# Patient Record
Sex: Female | Born: 1955 | Race: White | Hispanic: No | Smoking: Never smoker
Health system: Southern US, Community
[De-identification: ages and names within clinical notes are randomized; demographics above are authoritative.]

## PROBLEM LIST (undated history)

## (undated) DIAGNOSIS — I10 Essential (primary) hypertension: Secondary | ICD-10-CM

## (undated) DIAGNOSIS — S7290XA Unspecified fracture of unspecified femur, initial encounter for closed fracture: Secondary | ICD-10-CM

## (undated) DIAGNOSIS — E119 Type 2 diabetes mellitus without complications: Secondary | ICD-10-CM

## (undated) DIAGNOSIS — G4730 Sleep apnea, unspecified: Secondary | ICD-10-CM

## (undated) DIAGNOSIS — S329XXA Fracture of unspecified parts of lumbosacral spine and pelvis, initial encounter for closed fracture: Secondary | ICD-10-CM

## (undated) DIAGNOSIS — E0500 Thyrotoxicosis with diffuse goiter without thyrotoxic crisis or storm: Secondary | ICD-10-CM

## (undated) HISTORY — PX: COLONOSCOPY: SHX174

---
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
## 2004-05-07 ENCOUNTER — Ambulatory Visit: Payer: Self-pay | Admitting: Unknown Physician Specialty

## 2005-06-01 ENCOUNTER — Ambulatory Visit: Payer: Self-pay | Admitting: Internal Medicine

## 2005-06-03 ENCOUNTER — Emergency Department: Payer: Self-pay | Admitting: Emergency Medicine

## 2005-06-09 ENCOUNTER — Ambulatory Visit: Payer: Self-pay | Admitting: Oncology

## 2005-06-17 ENCOUNTER — Other Ambulatory Visit: Admission: RE | Admit: 2005-06-17 | Discharge: 2005-06-17 | Payer: Self-pay | Admitting: Oncology

## 2005-07-02 ENCOUNTER — Ambulatory Visit (HOSPITAL_COMMUNITY): Admission: RE | Admit: 2005-07-02 | Discharge: 2005-07-02 | Payer: Self-pay | Admitting: Oncology

## 2005-07-06 ENCOUNTER — Ambulatory Visit (HOSPITAL_COMMUNITY): Admission: RE | Admit: 2005-07-06 | Discharge: 2005-07-06 | Payer: Self-pay | Admitting: Oncology

## 2005-07-14 ENCOUNTER — Other Ambulatory Visit: Admission: RE | Admit: 2005-07-14 | Discharge: 2005-07-14 | Payer: Self-pay | Admitting: Oncology

## 2005-08-10 ENCOUNTER — Other Ambulatory Visit: Admission: RE | Admit: 2005-08-10 | Discharge: 2005-08-10 | Payer: Self-pay | Admitting: Oncology

## 2005-08-25 ENCOUNTER — Ambulatory Visit: Payer: Self-pay | Admitting: Gastroenterology

## 2006-02-19 ENCOUNTER — Emergency Department: Payer: Self-pay | Admitting: Unknown Physician Specialty

## 2007-06-15 ENCOUNTER — Emergency Department: Payer: Self-pay | Admitting: Emergency Medicine

## 2007-06-15 ENCOUNTER — Other Ambulatory Visit: Payer: Self-pay

## 2013-10-18 ENCOUNTER — Ambulatory Visit: Payer: Self-pay | Admitting: Family Medicine

## 2013-10-26 ENCOUNTER — Ambulatory Visit: Payer: Self-pay | Admitting: Gastroenterology

## 2013-10-27 ENCOUNTER — Emergency Department: Payer: Self-pay | Admitting: Emergency Medicine

## 2013-10-27 LAB — URINALYSIS, COMPLETE
BILIRUBIN, UR: NEGATIVE
Bacteria: NONE SEEN
Glucose,UR: NEGATIVE mg/dL (ref 0–75)
KETONE: NEGATIVE
Nitrite: NEGATIVE
PH: 6 (ref 4.5–8.0)
Protein: NEGATIVE
RBC,UR: 45 /HPF (ref 0–5)
SPECIFIC GRAVITY: 1.015 (ref 1.003–1.030)
Squamous Epithelial: 3

## 2013-10-27 LAB — COMPREHENSIVE METABOLIC PANEL
ALBUMIN: 3.9 g/dL (ref 3.4–5.0)
ALT: 29 U/L (ref 12–78)
Alkaline Phosphatase: 129 U/L — ABNORMAL HIGH
Anion Gap: 5 — ABNORMAL LOW (ref 7–16)
BUN: 17 mg/dL (ref 7–18)
Bilirubin,Total: 0.2 mg/dL (ref 0.2–1.0)
CHLORIDE: 106 mmol/L (ref 98–107)
Calcium, Total: 8.8 mg/dL (ref 8.5–10.1)
Co2: 29 mmol/L (ref 21–32)
Creatinine: 0.75 mg/dL (ref 0.60–1.30)
GLUCOSE: 115 mg/dL — AB (ref 65–99)
OSMOLALITY: 282 (ref 275–301)
Potassium: 3.7 mmol/L (ref 3.5–5.1)
SGOT(AST): 21 U/L (ref 15–37)
SODIUM: 140 mmol/L (ref 136–145)
Total Protein: 7.6 g/dL (ref 6.4–8.2)

## 2013-10-27 LAB — CBC
HCT: 44 % (ref 35.0–47.0)
HGB: 14 g/dL (ref 12.0–16.0)
MCH: 30.6 pg (ref 26.0–34.0)
MCHC: 31.9 g/dL — ABNORMAL LOW (ref 32.0–36.0)
MCV: 96 fL (ref 80–100)
Platelet: 285 10*3/uL (ref 150–440)
RBC: 4.58 10*6/uL (ref 3.80–5.20)
RDW: 13.7 % (ref 11.5–14.5)
WBC: 13.7 10*3/uL — AB (ref 3.6–11.0)

## 2013-11-05 ENCOUNTER — Ambulatory Visit: Payer: Self-pay | Admitting: Urology

## 2013-11-09 ENCOUNTER — Ambulatory Visit: Payer: Self-pay | Admitting: Urology

## 2014-10-11 ENCOUNTER — Ambulatory Visit
Admit: 2014-10-11 | Disposition: A | Discharge status: Home / Self Care | Payer: Self-pay | Attending: Internal Medicine | Admitting: Internal Medicine

## 2015-12-18 ENCOUNTER — Emergency Department
Admission: EM | Admit: 2015-12-18 | Discharge: 2015-12-19 | Disposition: A | Discharge status: Home / Self Care | Payer: Managed Care, Other (non HMO) | Attending: Emergency Medicine | Admitting: Emergency Medicine

## 2015-12-18 DIAGNOSIS — Y939 Activity, unspecified: Secondary | ICD-10-CM | POA: Insufficient documentation

## 2015-12-18 DIAGNOSIS — T18108A Unspecified foreign body in esophagus causing other injury, initial encounter: Secondary | ICD-10-CM | POA: Insufficient documentation

## 2015-12-18 DIAGNOSIS — X58XXXA Exposure to other specified factors, initial encounter: Secondary | ICD-10-CM | POA: Insufficient documentation

## 2015-12-18 DIAGNOSIS — Y999 Unspecified external cause status: Secondary | ICD-10-CM | POA: Diagnosis not present

## 2015-12-18 DIAGNOSIS — Y929 Unspecified place or not applicable: Secondary | ICD-10-CM | POA: Diagnosis not present

## 2015-12-18 DIAGNOSIS — T18128A Food in esophagus causing other injury, initial encounter: Secondary | ICD-10-CM

## 2015-12-18 DIAGNOSIS — S1015XA Superficial foreign body of throat, initial encounter: Secondary | ICD-10-CM | POA: Diagnosis present

## 2015-12-18 MED ORDER — GLUCAGON HCL (RDNA) 1 MG IJ SOLR
1.0000 mg | Freq: Once | INTRAMUSCULAR | Status: AC
  Administered 2015-12-18: 1 mg via INTRAVENOUS

## 2015-12-18 MED ORDER — GLUCAGON HCL RDNA (DIAGNOSTIC) 1 MG IJ SOLR
INTRAMUSCULAR | Status: AC
  Administered 2015-12-18: 1 mg via INTRAVENOUS
  Filled 2015-12-18: qty 1

## 2015-12-18 NOTE — ED Notes (Addendum)
Gave pt coke after glucagon injection per MD Manson PasseyBrown request. Pt vomited coke immediately. Pt reports she still feels object stuck in upper abdomen. MD Manson PasseyBrown notified

## 2015-12-18 NOTE — ED Notes (Signed)
Pt with feeling like she has something stuck in throat after eating chicken nuggets, pt spitting in triage.

## 2015-12-18 NOTE — ED Notes (Signed)
Pt reports eating a chicken nugget at approx 1900 this evening and reports it feels like it got stuck in her upper medial abdomen. Pt c/o upper medial abdominal pain and nausea

## 2015-12-19 MED ORDER — ONDANSETRON HCL 4 MG/2ML IJ SOLN
INTRAMUSCULAR | Status: AC
  Filled 2015-12-19: qty 2

## 2015-12-19 MED ORDER — MORPHINE SULFATE (PF) 2 MG/ML IV SOLN
2.0000 mg | Freq: Once | INTRAVENOUS | Status: AC
  Administered 2015-12-19: 2 mg via INTRAVENOUS
  Filled 2015-12-19: qty 1

## 2015-12-19 MED ORDER — GLUCAGON HCL (RDNA) 1 MG IJ SOLR: 1.0000 mg | Freq: Once | INTRAMUSCULAR | Status: DC

## 2015-12-19 MED ORDER — ONDANSETRON HCL 4 MG/2ML IJ SOLN
4.0000 mg | Freq: Once | INTRAMUSCULAR | Status: AC
  Administered 2015-12-19: 4 mg via INTRAVENOUS

## 2015-12-19 NOTE — ED Notes (Signed)
Pt became nauseated after morphine administration. MD Manson PasseyBrown notified. MD ordered 4 mg Zofran IV

## 2015-12-19 NOTE — ED Notes (Signed)
Pt's Niece Lillia Paulsllen McBane would like to be called for any changes in pt status: [phone]. Pt gave approval to speak with niece

## 2015-12-19 NOTE — ED Notes (Signed)
Pt reports symptoms have resolved. MD Manson PasseyBrown ordered PO challenge. Pt brought water.

## 2015-12-19 NOTE — ED Notes (Signed)
MD Manson PasseyBrown ordered to hold glucagon

## 2015-12-19 NOTE — ED Provider Notes (Signed)
[facility] Emergency Department Provider Note  ____________________________________________  Time seen: 11:50 PM  I have reviewed the triage vital signs and the nursing notes.   HISTORY  Chief Complaint Foreign Body      HPI Jackie Robinson is a 60 y.o. female presents with sensation of something stuck in her throat since eating chicken nuggets at approximately 7 PM. Patient points to the lower chest as location of the sensation. Patient admits to inability to swallow oral secretions. To be spitting and a emesis bag at the time of evaluation.     Past medical history Esophageal dilation There are no active problems to display for this patient.   Past surgical history Esophageal dilation No current outpatient prescriptions on file.  Allergies Amoxicillin and Contrast media  No family history on file.  Social History Social History  Substance Use Topics  . Smoking status: Not on file  . Smokeless tobacco: Not on file  . Alcohol Use: Not on file    Review of Systems  Constitutional: Negative for fever. Eyes: Negative for visual changes. ENT: Negative for sore throat.Positive for foreign body in throat Cardiovascular: Negative for chest pain. Respiratory: Negative for shortness of breath. Gastrointestinal: Negative for abdominal pain, vomiting and diarrhea. Genitourinary: Negative for dysuria. Musculoskeletal: Negative for back pain. Skin: Negative for rash. Neurological: Negative for headaches, focal weakness or numbness.   10-point ROS otherwise negative.  ____________________________________________   PHYSICAL EXAM:  VITAL SIGNS: ED Triage Vitals  Enc Vitals Group     BP 12/18/15 2330 139/99 mmHg     Pulse Rate 12/18/15 2330 79     Resp 12/18/15 2330 18     Temp 12/18/15 2330 97.7 F (36.5 C)     Temp Source 12/18/15 2330 Oral     SpO2 12/18/15 2330 99 %     Weight 12/18/15 2330 175 lb (79.379 kg)     Height 12/18/15  2330 5\' 6"  (1.676 m)     Head Cir --      Peak Flow --      Pain Score 12/18/15 2331 8     Pain Loc --      Pain Edu? --      Excl. in GC? --     Constitutional: Alert and oriented. Actively spitting in the emesis bag Eyes: Conjunctivae are normal. PERRL. Normal extraocular movements. ENT   Head: Normocephalic and atraumatic.   Nose: No congestion/rhinnorhea.   Mouth/Throat: Mucous membranes are moist.   Neck: No stridor. Hematological/Lymphatic/Immunilogical: No cervical lymphadenopathy. Cardiovascular: Normal rate, regular rhythm. Normal and symmetric distal pulses are present in all extremities. No murmurs, rubs, or gallops. Respiratory: Normal respiratory effort without tachypnea nor retractions. Breath sounds are clear and equal bilaterally. No wheezes/rales/rhonchi. Gastrointestinal: Soft and nontender. No distention. There is no CVA tenderness. Genitourinary: deferred Musculoskeletal: Nontender with normal range of motion in all extremities. No joint effusions.  No lower extremity tenderness nor edema. Neurologic:  Normal speech and language. No gross focal neurologic deficits are appreciated. Speech is normal.  Skin:  Skin is warm, dry and intact. No rash noted. Psychiatric: Mood and affect are normal. Speech and behavior are normal. Patient exhibits appropriate insight and judgment.    Procedures    INITIAL IMPRESSION / ASSESSMENT AND PLAN / ED COURSE  Pertinent labs & imaging results that were available during my care of the patient were reviewed by me and considered in my medical decision making (see chart for details).  Patient received  IV glucagon 1 mg as well as 2 mg of morphine without any improvement in symptoms.  I notified Dr. Servando SnareWohl gastroenterology on-call   I was notified by the nursing staff that the patient had continued discomfort and a such additional dose of morphine was given. 3:10 AM I reevaluated patient who stated that sensation has  resolved. She is tolerating oral secretions at this time liquid was given a drink that was given and tolerated well. ____________________________________________   FINAL CLINICAL IMPRESSION(S) / ED DIAGNOSES  Final diagnoses:  Food impaction of esophagus, initial encounter      Darci Currentandolph N Brown, MD 12/19/15 [phone]

## 2016-11-05 ENCOUNTER — Other Ambulatory Visit: Payer: Self-pay | Admitting: Internal Medicine

## 2016-11-05 DIAGNOSIS — L03211 Cellulitis of face: Secondary | ICD-10-CM

## 2016-11-05 DIAGNOSIS — E890 Postprocedural hypothyroidism: Secondary | ICD-10-CM

## 2016-11-05 DIAGNOSIS — I10 Essential (primary) hypertension: Secondary | ICD-10-CM

## 2016-11-08 ENCOUNTER — Ambulatory Visit
Admission: RE | Admit: 2016-11-08 | Discharge: 2016-11-08 | Disposition: A | Discharge status: Home / Self Care | Payer: BLUE CROSS/BLUE SHIELD | Source: Ambulatory Visit | Attending: Internal Medicine | Admitting: Internal Medicine

## 2016-11-08 DIAGNOSIS — E890 Postprocedural hypothyroidism: Secondary | ICD-10-CM

## 2016-11-08 DIAGNOSIS — L03211 Cellulitis of face: Secondary | ICD-10-CM

## 2016-11-08 DIAGNOSIS — I10 Essential (primary) hypertension: Secondary | ICD-10-CM

## 2016-11-09 ENCOUNTER — Ambulatory Visit
Admission: RE | Admit: 2016-11-09 | Discharge: 2016-11-09 | Disposition: A | Discharge status: Home / Self Care | Payer: BLUE CROSS/BLUE SHIELD | Source: Ambulatory Visit | Attending: Internal Medicine | Admitting: Internal Medicine

## 2016-11-09 DIAGNOSIS — H0520 Unspecified exophthalmos: Secondary | ICD-10-CM | POA: Insufficient documentation

## 2016-11-09 DIAGNOSIS — I878 Other specified disorders of veins: Secondary | ICD-10-CM | POA: Insufficient documentation

## 2016-11-09 DIAGNOSIS — L03211 Cellulitis of face: Secondary | ICD-10-CM

## 2016-11-09 DIAGNOSIS — E890 Postprocedural hypothyroidism: Secondary | ICD-10-CM

## 2016-11-09 DIAGNOSIS — I10 Essential (primary) hypertension: Secondary | ICD-10-CM

## 2016-11-09 HISTORY — DX: Type 2 diabetes mellitus without complications: E11.9

## 2016-11-09 HISTORY — DX: Essential (primary) hypertension: I10

## 2016-11-09 MED ORDER — IOPAMIDOL (ISOVUE-300) INJECTION 61%
75.0000 mL | Freq: Once | INTRAVENOUS | Status: AC | PRN
  Administered 2016-11-09: 75 mL via INTRAVENOUS

## 2016-11-24 ENCOUNTER — Ambulatory Visit: Payer: BLUE CROSS/BLUE SHIELD

## 2016-12-23 ENCOUNTER — Other Ambulatory Visit: Payer: Self-pay | Admitting: Infectious Diseases

## 2016-12-23 DIAGNOSIS — L03213 Periorbital cellulitis: Secondary | ICD-10-CM

## 2016-12-30 ENCOUNTER — Ambulatory Visit
Admission: RE | Admit: 2016-12-30 | Discharge: 2016-12-30 | Disposition: A | Discharge status: Home / Self Care | Payer: BLUE CROSS/BLUE SHIELD | Source: Ambulatory Visit | Attending: Infectious Diseases | Admitting: Infectious Diseases

## 2016-12-30 DIAGNOSIS — H0520 Unspecified exophthalmos: Secondary | ICD-10-CM | POA: Diagnosis not present

## 2016-12-30 DIAGNOSIS — L03213 Periorbital cellulitis: Secondary | ICD-10-CM | POA: Insufficient documentation

## 2016-12-30 MED ORDER — GADOBENATE DIMEGLUMINE 529 MG/ML IV SOLN
18.0000 mL | Freq: Once | INTRAVENOUS | Status: AC | PRN
  Administered 2016-12-30: 18 mL via INTRAVENOUS

## 2017-01-24 ENCOUNTER — Ambulatory Visit
Admission: RE | Admit: 2017-01-24 | Discharge: 2017-01-24 | Disposition: A | Discharge status: Home / Self Care | Payer: BLUE CROSS/BLUE SHIELD | Source: Ambulatory Visit | Attending: Ophthalmology | Admitting: Ophthalmology

## 2017-01-24 DIAGNOSIS — E0500 Thyrotoxicosis with diffuse goiter without thyrotoxic crisis or storm: Secondary | ICD-10-CM | POA: Insufficient documentation

## 2017-01-24 DIAGNOSIS — H0520 Unspecified exophthalmos: Secondary | ICD-10-CM | POA: Insufficient documentation

## 2017-01-24 HISTORY — DX: Unspecified fracture of unspecified femur, initial encounter for closed fracture: S72.90XA

## 2017-01-24 HISTORY — DX: Thyrotoxicosis with diffuse goiter without thyrotoxic crisis or storm: E05.00

## 2017-01-24 HISTORY — DX: Sleep apnea, unspecified: G47.30

## 2017-01-24 HISTORY — DX: Fracture of unspecified parts of lumbosacral spine and pelvis, initial encounter for closed fracture: S32.9XXA

## 2017-01-24 LAB — COMPREHENSIVE METABOLIC PANEL
ALK PHOS: 76 U/L (ref 38–126)
ALT: 25 U/L (ref 14–54)
ANION GAP: 7 (ref 5–15)
AST: 15 U/L (ref 15–41)
Albumin: 3.9 g/dL (ref 3.5–5.0)
BUN: 22 mg/dL — ABNORMAL HIGH (ref 6–20)
CO2: 25 mmol/L (ref 22–32)
Calcium: 8.8 mg/dL — ABNORMAL LOW (ref 8.9–10.3)
Chloride: 108 mmol/L (ref 101–111)
Creatinine, Ser: 0.57 mg/dL (ref 0.44–1.00)
Glucose, Bld: 99 mg/dL (ref 65–99)
Potassium: 3.9 mmol/L (ref 3.5–5.1)
SODIUM: 140 mmol/L (ref 135–145)
TOTAL PROTEIN: 6.8 g/dL (ref 6.5–8.1)
Total Bilirubin: 0.6 mg/dL (ref 0.3–1.2)

## 2017-01-24 MED ORDER — SODIUM CHLORIDE 0.9 % IV SOLN
500.0000 mg | INTRAVENOUS | Status: DC
Start: 2017-01-24 — End: 2017-01-25
  Administered 2017-01-24: 500 mg via INTRAVENOUS
  Filled 2017-01-24: qty 4

## 2017-01-24 MED ORDER — SODIUM CHLORIDE FLUSH 0.9 % IV SOLN
INTRAVENOUS | Status: AC
  Filled 2017-01-24: qty 10

## 2017-01-31 ENCOUNTER — Ambulatory Visit
Admission: RE | Admit: 2017-01-31 | Discharge: 2017-01-31 | Disposition: A | Discharge status: Home / Self Care | Payer: BLUE CROSS/BLUE SHIELD | Source: Ambulatory Visit | Attending: Ophthalmology | Admitting: Ophthalmology

## 2017-01-31 DIAGNOSIS — H0520 Unspecified exophthalmos: Secondary | ICD-10-CM | POA: Insufficient documentation

## 2017-01-31 DIAGNOSIS — E0500 Thyrotoxicosis with diffuse goiter without thyrotoxic crisis or storm: Secondary | ICD-10-CM | POA: Diagnosis present

## 2017-01-31 LAB — COMPREHENSIVE METABOLIC PANEL
ALBUMIN: 4.1 g/dL (ref 3.5–5.0)
ALK PHOS: 90 U/L (ref 38–126)
ALT: 21 U/L (ref 14–54)
AST: 16 U/L (ref 15–41)
Anion gap: 8 (ref 5–15)
BUN: 21 mg/dL — AB (ref 6–20)
CHLORIDE: 109 mmol/L (ref 101–111)
CO2: 24 mmol/L (ref 22–32)
CREATININE: 0.67 mg/dL (ref 0.44–1.00)
Calcium: 8.7 mg/dL — ABNORMAL LOW (ref 8.9–10.3)
GFR calc Af Amer: >60 mL/min (ref >60)
Glucose, Bld: 120 mg/dL — ABNORMAL HIGH (ref 65–99)
Potassium: 3.9 mmol/L (ref 3.5–5.1)
SODIUM: 141 mmol/L (ref 135–145)
Total Bilirubin: 0.5 mg/dL (ref 0.3–1.2)
Total Protein: 6.9 g/dL (ref 6.5–8.1)

## 2017-01-31 MED ORDER — SODIUM CHLORIDE 0.9 % IV SOLN
500.0000 mg | Freq: Once | INTRAVENOUS | Status: AC
  Administered 2017-01-31: 500 mg via INTRAVENOUS
  Filled 2017-01-31: qty 4

## 2017-01-31 NOTE — OR Nursing (Signed)
Lab tech in @ 1015 for cmp draw

## 2017-02-07 ENCOUNTER — Ambulatory Visit
Admission: RE | Admit: 2017-02-07 | Discharge: 2017-02-07 | Disposition: A | Discharge status: Home / Self Care | Payer: BLUE CROSS/BLUE SHIELD | Source: Ambulatory Visit | Attending: Ophthalmology | Admitting: Ophthalmology

## 2017-02-07 DIAGNOSIS — E0500 Thyrotoxicosis with diffuse goiter without thyrotoxic crisis or storm: Secondary | ICD-10-CM | POA: Diagnosis present

## 2017-02-07 LAB — COMPREHENSIVE METABOLIC PANEL
ALBUMIN: 4 g/dL (ref 3.5–5.0)
ALT: 20 U/L (ref 14–54)
AST: 18 U/L (ref 15–41)
Alkaline Phosphatase: 86 U/L (ref 38–126)
Anion gap: 9 (ref 5–15)
BUN: 24 mg/dL — AB (ref 6–20)
CHLORIDE: 109 mmol/L (ref 101–111)
CO2: 24 mmol/L (ref 22–32)
CREATININE: 0.6 mg/dL (ref 0.44–1.00)
Calcium: 9.2 mg/dL (ref 8.9–10.3)
GFR calc Af Amer: >60 mL/min (ref >60)
GFR calc non Af Amer: >60 mL/min (ref >60)
Glucose, Bld: 113 mg/dL — ABNORMAL HIGH (ref 65–99)
POTASSIUM: 4.2 mmol/L (ref 3.5–5.1)
SODIUM: 142 mmol/L (ref 135–145)
Total Bilirubin: 0.4 mg/dL (ref 0.3–1.2)
Total Protein: 7 g/dL (ref 6.5–8.1)

## 2017-02-07 MED ORDER — SODIUM CHLORIDE FLUSH 0.9 % IV SOLN
INTRAVENOUS | Status: AC
  Administered 2017-02-07: 11:00:00
  Filled 2017-02-07: qty 10

## 2017-02-07 MED ORDER — SODIUM CHLORIDE 0.9 % IV SOLN
500.0000 mg | Freq: Once | INTRAVENOUS | Status: AC
Start: 2017-02-07 — End: 2017-02-07
  Administered 2017-02-07: 500 mg via INTRAVENOUS
  Filled 2017-02-07: qty 4

## 2017-02-14 ENCOUNTER — Ambulatory Visit
Admission: RE | Admit: 2017-02-14 | Discharge: 2017-02-14 | Disposition: A | Discharge status: Home / Self Care | Payer: BLUE CROSS/BLUE SHIELD | Source: Ambulatory Visit | Attending: Ophthalmology | Admitting: Ophthalmology

## 2017-02-14 DIAGNOSIS — H0520 Unspecified exophthalmos: Secondary | ICD-10-CM | POA: Diagnosis present

## 2017-02-14 DIAGNOSIS — E0500 Thyrotoxicosis with diffuse goiter without thyrotoxic crisis or storm: Secondary | ICD-10-CM | POA: Diagnosis present

## 2017-02-14 LAB — COMPREHENSIVE METABOLIC PANEL
ALBUMIN: 4.2 g/dL (ref 3.5–5.0)
ALK PHOS: 103 U/L (ref 38–126)
ALT: 44 U/L (ref 14–54)
ANION GAP: 10 (ref 5–15)
AST: 26 U/L (ref 15–41)
BILIRUBIN TOTAL: 0.8 mg/dL (ref 0.3–1.2)
BUN: 19 mg/dL (ref 6–20)
CALCIUM: 9.3 mg/dL (ref 8.9–10.3)
CO2: 24 mmol/L (ref 22–32)
Chloride: 107 mmol/L (ref 101–111)
Creatinine, Ser: 0.69 mg/dL (ref 0.44–1.00)
GFR calc non Af Amer: >60 mL/min (ref >60)
GLUCOSE: 97 mg/dL (ref 65–99)
POTASSIUM: 4.9 mmol/L (ref 3.5–5.1)
SODIUM: 141 mmol/L (ref 135–145)
TOTAL PROTEIN: 7.5 g/dL (ref 6.5–8.1)

## 2017-02-14 MED ORDER — SODIUM CHLORIDE 0.9 % IV SOLN
500.0000 mg | Freq: Once | INTRAVENOUS | Status: AC
  Administered 2017-02-14: 500 mg via INTRAVENOUS
  Filled 2017-02-14: qty 4

## 2017-02-22 ENCOUNTER — Ambulatory Visit
Admission: RE | Admit: 2017-02-22 | Discharge: 2017-02-22 | Disposition: A | Discharge status: Home / Self Care | Payer: BLUE CROSS/BLUE SHIELD | Source: Ambulatory Visit | Attending: Ophthalmology | Admitting: Ophthalmology

## 2017-02-22 DIAGNOSIS — H0520 Unspecified exophthalmos: Secondary | ICD-10-CM | POA: Diagnosis present

## 2017-02-22 DIAGNOSIS — E0500 Thyrotoxicosis with diffuse goiter without thyrotoxic crisis or storm: Secondary | ICD-10-CM | POA: Diagnosis present

## 2017-02-22 LAB — COMPREHENSIVE METABOLIC PANEL
ALK PHOS: 77 U/L (ref 38–126)
ALT: 14 U/L (ref 14–54)
AST: 13 U/L — ABNORMAL LOW (ref 15–41)
Albumin: 3.8 g/dL (ref 3.5–5.0)
Anion gap: 8 (ref 5–15)
BUN: 20 mg/dL (ref 6–20)
CALCIUM: 8.5 mg/dL — AB (ref 8.9–10.3)
CO2: 26 mmol/L (ref 22–32)
CREATININE: 0.55 mg/dL (ref 0.44–1.00)
Chloride: 107 mmol/L (ref 101–111)
Glucose, Bld: 94 mg/dL (ref 65–99)
Potassium: 3.8 mmol/L (ref 3.5–5.1)
SODIUM: 141 mmol/L (ref 135–145)
Total Bilirubin: 0.4 mg/dL (ref 0.3–1.2)
Total Protein: 6.8 g/dL (ref 6.5–8.1)

## 2017-02-22 MED ORDER — SODIUM CHLORIDE 0.9 % IV SOLN
500.0000 mg | Freq: Once | INTRAVENOUS | Status: AC
  Administered 2017-02-22: 500 mg via INTRAVENOUS
  Filled 2017-02-22: qty 4

## 2017-02-26 ENCOUNTER — Encounter: Payer: Self-pay | Admitting: Emergency Medicine

## 2017-02-26 DIAGNOSIS — I10 Essential (primary) hypertension: Secondary | ICD-10-CM | POA: Diagnosis not present

## 2017-02-26 DIAGNOSIS — K222 Esophageal obstruction: Secondary | ICD-10-CM | POA: Insufficient documentation

## 2017-02-26 DIAGNOSIS — R0989 Other specified symptoms and signs involving the circulatory and respiratory systems: Secondary | ICD-10-CM | POA: Diagnosis present

## 2017-02-26 DIAGNOSIS — Y9389 Activity, other specified: Secondary | ICD-10-CM | POA: Diagnosis not present

## 2017-02-26 DIAGNOSIS — X58XXXA Exposure to other specified factors, initial encounter: Secondary | ICD-10-CM | POA: Diagnosis not present

## 2017-02-26 DIAGNOSIS — Y929 Unspecified place or not applicable: Secondary | ICD-10-CM | POA: Insufficient documentation

## 2017-02-26 DIAGNOSIS — Z79899 Other long term (current) drug therapy: Secondary | ICD-10-CM | POA: Insufficient documentation

## 2017-02-26 DIAGNOSIS — Y999 Unspecified external cause status: Secondary | ICD-10-CM | POA: Insufficient documentation

## 2017-02-26 DIAGNOSIS — E119 Type 2 diabetes mellitus without complications: Secondary | ICD-10-CM | POA: Diagnosis not present

## 2017-02-26 DIAGNOSIS — T18128A Food in esophagus causing other injury, initial encounter: Secondary | ICD-10-CM | POA: Insufficient documentation

## 2017-02-26 NOTE — ED Notes (Signed)
Patient to ambulated to stat desk asking about wait time. Patient given an update.

## 2017-02-26 NOTE — ED Triage Notes (Signed)
Patient states that she was eating grilled chicken about 15:00 today and feels like a piece become stuck. Patient unable to swallow saliva. Patient with no difficulty breathing at this time.

## 2017-02-27 ENCOUNTER — Emergency Department: Payer: BLUE CROSS/BLUE SHIELD

## 2017-02-27 ENCOUNTER — Emergency Department
Admission: EM | Admit: 2017-02-27 | Discharge: 2017-02-27 | Disposition: A | Discharge status: Home / Self Care | Payer: BLUE CROSS/BLUE SHIELD | Attending: Emergency Medicine | Admitting: Emergency Medicine

## 2017-02-27 DIAGNOSIS — T18128A Food in esophagus causing other injury, initial encounter: Secondary | ICD-10-CM

## 2017-02-27 DIAGNOSIS — K222 Esophageal obstruction: Secondary | ICD-10-CM

## 2017-02-27 LAB — COMPREHENSIVE METABOLIC PANEL
ALK PHOS: 85 U/L (ref 38–126)
ALT: 14 U/L (ref 14–54)
ANION GAP: 8 (ref 5–15)
AST: 12 U/L — ABNORMAL LOW (ref 15–41)
Albumin: 4.7 g/dL (ref 3.5–5.0)
BILIRUBIN TOTAL: 0.5 mg/dL (ref 0.3–1.2)
BUN: 23 mg/dL — AB (ref 6–20)
CALCIUM: 9.5 mg/dL (ref 8.9–10.3)
CO2: 29 mmol/L (ref 22–32)
Chloride: 108 mmol/L (ref 101–111)
Creatinine, Ser: 0.69 mg/dL (ref 0.44–1.00)
GFR calc Af Amer: >60 mL/min (ref >60)
GLUCOSE: 120 mg/dL — AB (ref 65–99)
Potassium: 3.6 mmol/L (ref 3.5–5.1)
Sodium: 145 mmol/L (ref 135–145)
TOTAL PROTEIN: 8.1 g/dL (ref 6.5–8.1)

## 2017-02-27 LAB — CBC WITH DIFFERENTIAL/PLATELET
BASOS PCT: 1 %
Basophils Absolute: 0.1 10*3/uL (ref 0–0.1)
Eosinophils Absolute: 0.1 10*3/uL (ref 0–0.7)
Eosinophils Relative: 1 %
HEMATOCRIT: 49 % — AB (ref 35.0–47.0)
HEMOGLOBIN: 16.5 g/dL — AB (ref 12.0–16.0)
LYMPHS ABS: 3.6 10*3/uL (ref 1.0–3.6)
LYMPHS PCT: 28 %
MCH: 30.9 pg (ref 26.0–34.0)
MCHC: 33.7 g/dL (ref 32.0–36.0)
MCV: 91.7 fL (ref 80.0–100.0)
Monocytes Absolute: 1.7 10*3/uL — ABNORMAL HIGH (ref 0.2–0.9)
Monocytes Relative: 13 %
NEUTROS ABS: 7.5 10*3/uL — AB (ref 1.4–6.5)
NEUTROS PCT: 57 %
Platelets: 284 10*3/uL (ref 150–440)
RBC: 5.34 MIL/uL — ABNORMAL HIGH (ref 3.80–5.20)
RDW: 14.3 % (ref 11.5–14.5)
WBC: 12.9 10*3/uL — ABNORMAL HIGH (ref 3.6–11.0)

## 2017-02-27 LAB — LIPASE, BLOOD: LIPASE: 18 U/L (ref 11–51)

## 2017-02-27 MED ORDER — LORAZEPAM 2 MG/ML IJ SOLN
1.0000 mg | Freq: Once | INTRAMUSCULAR | Status: AC
  Administered 2017-02-27: 1 mg via INTRAVENOUS
  Filled 2017-02-27: qty 1

## 2017-02-27 MED ORDER — ONDANSETRON HCL 4 MG/2ML IJ SOLN
4.0000 mg | Freq: Once | INTRAMUSCULAR | Status: AC
  Administered 2017-02-27: 4 mg via INTRAVENOUS
  Filled 2017-02-27: qty 2

## 2017-02-27 MED ORDER — SODIUM CHLORIDE 0.9 % IV BOLUS (SEPSIS)
1000.0000 mL | Freq: Once | INTRAVENOUS | Status: AC
  Administered 2017-02-27: 1000 mL via INTRAVENOUS

## 2017-02-27 MED ORDER — GLUCAGON HCL (RDNA) 1 MG IJ SOLR
1.0000 mg | Freq: Once | INTRAMUSCULAR | Status: AC
  Administered 2017-02-27: 1 mg via INTRAVENOUS
  Filled 2017-02-27: qty 1

## 2017-02-27 NOTE — ED Notes (Signed)
Pt reports food stuck in lower esophagus (indicates epigastric area); spitting out saliva, reports this is second time happening, at last endoscopy reports that "doctor stretched it [esophagus] for me"  No difficulty breathing att, reports level of pain as discomfort from food

## 2017-02-27 NOTE — ED Notes (Signed)
Pt reports food bolus resolved, EDP notified, ginger ale given and drank without difficulty

## 2017-02-27 NOTE — ED Notes (Signed)
Patient transported to X-ray 
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
## 2017-02-27 NOTE — Discharge Instructions (Signed)
Please make an appointment to see your GI doctor as soon as possible. You will likely need to your esophagus stretched. Please blend your food; do not eat chunks of meat or bread. Return to the ER for worsening symptoms, persistent vomiting, difficulty breathing or other concerns.

## 2017-02-28 ENCOUNTER — Ambulatory Visit
Admission: RE | Admit: 2017-02-28 | Discharge: 2017-02-28 | Disposition: A | Discharge status: Home / Self Care | Payer: BLUE CROSS/BLUE SHIELD | Source: Ambulatory Visit | Attending: Ophthalmology | Admitting: Ophthalmology

## 2017-02-28 DIAGNOSIS — E0500 Thyrotoxicosis with diffuse goiter without thyrotoxic crisis or storm: Secondary | ICD-10-CM | POA: Diagnosis not present

## 2017-02-28 DIAGNOSIS — H0520 Unspecified exophthalmos: Secondary | ICD-10-CM | POA: Diagnosis not present

## 2017-02-28 DIAGNOSIS — Z01812 Encounter for preprocedural laboratory examination: Secondary | ICD-10-CM | POA: Insufficient documentation

## 2017-02-28 LAB — COMPREHENSIVE METABOLIC PANEL
ALBUMIN: 3.8 g/dL (ref 3.5–5.0)
ALT: 15 U/L (ref 14–54)
ANION GAP: 8 (ref 5–15)
AST: 15 U/L (ref 15–41)
Alkaline Phosphatase: 80 U/L (ref 38–126)
BUN: 20 mg/dL (ref 6–20)
CHLORIDE: 108 mmol/L (ref 101–111)
CO2: 24 mmol/L (ref 22–32)
CREATININE: 0.63 mg/dL (ref 0.44–1.00)
Calcium: 8.7 mg/dL — ABNORMAL LOW (ref 8.9–10.3)
Glucose, Bld: 130 mg/dL — ABNORMAL HIGH (ref 65–99)
Potassium: 3.7 mmol/L (ref 3.5–5.1)
Sodium: 140 mmol/L (ref 135–145)
Total Bilirubin: 0.4 mg/dL (ref 0.3–1.2)
Total Protein: 6.6 g/dL (ref 6.5–8.1)

## 2017-02-28 MED ORDER — METHYLPREDNISOLONE SODIUM SUCC 1000 MG IJ SOLR
500.0000 mg | Freq: Once | INTRAMUSCULAR | Status: AC
Start: 2017-02-28 — End: 2017-02-28
  Administered 2017-02-28: 500 mg via INTRAVENOUS
  Filled 2017-02-28: qty 4

## 2017-03-07 ENCOUNTER — Ambulatory Visit
Admission: RE | Admit: 2017-03-07 | Discharge: 2017-03-07 | Disposition: A | Discharge status: Home / Self Care | Payer: BLUE CROSS/BLUE SHIELD | Source: Ambulatory Visit | Attending: Ophthalmology | Admitting: Ophthalmology

## 2017-03-07 DIAGNOSIS — H0520 Unspecified exophthalmos: Secondary | ICD-10-CM | POA: Diagnosis present

## 2017-03-07 DIAGNOSIS — E0500 Thyrotoxicosis with diffuse goiter without thyrotoxic crisis or storm: Secondary | ICD-10-CM | POA: Insufficient documentation

## 2017-03-07 LAB — COMPREHENSIVE METABOLIC PANEL
ALBUMIN: 4.1 g/dL (ref 3.5–5.0)
ALT: 28 U/L (ref 14–54)
AST: 20 U/L (ref 15–41)
Alkaline Phosphatase: 91 U/L (ref 38–126)
Anion gap: 8 (ref 5–15)
BILIRUBIN TOTAL: 0.5 mg/dL (ref 0.3–1.2)
BUN: 21 mg/dL — ABNORMAL HIGH (ref 6–20)
CO2: 27 mmol/L (ref 22–32)
Calcium: 9.2 mg/dL (ref 8.9–10.3)
Chloride: 104 mmol/L (ref 101–111)
Creatinine, Ser: 0.54 mg/dL (ref 0.44–1.00)
GFR calc Af Amer: >60 mL/min (ref >60)
GFR calc non Af Amer: >60 mL/min (ref >60)
GLUCOSE: 100 mg/dL — AB (ref 65–99)
POTASSIUM: 3.9 mmol/L (ref 3.5–5.1)
SODIUM: 139 mmol/L (ref 135–145)
TOTAL PROTEIN: 7.2 g/dL (ref 6.5–8.1)

## 2017-03-07 MED ORDER — SODIUM CHLORIDE 0.9 % IV SOLN
250.0000 mg | Freq: Once | INTRAVENOUS | Status: AC
  Administered 2017-03-07: 250 mg via INTRAVENOUS
  Filled 2017-03-07: qty 2

## 2017-03-14 ENCOUNTER — Ambulatory Visit
Admission: RE | Admit: 2017-03-14 | Discharge: 2017-03-14 | Disposition: A | Discharge status: Home / Self Care | Payer: BLUE CROSS/BLUE SHIELD | Source: Ambulatory Visit | Attending: Ophthalmology | Admitting: Ophthalmology

## 2017-03-14 DIAGNOSIS — E0500 Thyrotoxicosis with diffuse goiter without thyrotoxic crisis or storm: Secondary | ICD-10-CM | POA: Insufficient documentation

## 2017-03-14 LAB — COMPREHENSIVE METABOLIC PANEL
ALBUMIN: 3.9 g/dL (ref 3.5–5.0)
ALK PHOS: 92 U/L (ref 38–126)
ALT: 18 U/L (ref 14–54)
AST: 19 U/L (ref 15–41)
Anion gap: 8 (ref 5–15)
BILIRUBIN TOTAL: 0.6 mg/dL (ref 0.3–1.2)
BUN: 19 mg/dL (ref 6–20)
CO2: 26 mmol/L (ref 22–32)
Calcium: 9.2 mg/dL (ref 8.9–10.3)
Chloride: 106 mmol/L (ref 101–111)
Creatinine, Ser: 0.64 mg/dL (ref 0.44–1.00)
GFR calc Af Amer: >60 mL/min (ref >60)
GFR calc non Af Amer: >60 mL/min (ref >60)
GLUCOSE: 108 mg/dL — AB (ref 65–99)
POTASSIUM: 4.1 mmol/L (ref 3.5–5.1)
SODIUM: 140 mmol/L (ref 135–145)
TOTAL PROTEIN: 7.2 g/dL (ref 6.5–8.1)

## 2017-03-14 MED ORDER — SODIUM CHLORIDE 0.9 % IV SOLN
250.0000 mg | Freq: Once | INTRAVENOUS | Status: AC
  Administered 2017-03-14: 250 mg via INTRAVENOUS
  Filled 2017-03-14: qty 2

## 2017-03-14 MED ORDER — SODIUM CHLORIDE FLUSH 0.9 % IV SOLN
INTRAVENOUS | Status: AC
  Filled 2017-03-14: qty 10

## 2017-03-16 ENCOUNTER — Ambulatory Visit: Payer: Self-pay | Admitting: Urology

## 2017-03-21 ENCOUNTER — Ambulatory Visit
Admission: RE | Admit: 2017-03-21 | Discharge: 2017-03-21 | Disposition: A | Discharge status: Home / Self Care | Payer: BLUE CROSS/BLUE SHIELD | Source: Ambulatory Visit | Attending: Ophthalmology | Admitting: Ophthalmology

## 2017-03-21 ENCOUNTER — Other Ambulatory Visit: Payer: Self-pay | Admitting: Internal Medicine

## 2017-03-21 DIAGNOSIS — Z1231 Encounter for screening mammogram for malignant neoplasm of breast: Secondary | ICD-10-CM

## 2017-03-21 DIAGNOSIS — E0500 Thyrotoxicosis with diffuse goiter without thyrotoxic crisis or storm: Secondary | ICD-10-CM | POA: Insufficient documentation

## 2017-03-21 DIAGNOSIS — H0520 Unspecified exophthalmos: Secondary | ICD-10-CM | POA: Insufficient documentation

## 2017-03-21 LAB — COMPREHENSIVE METABOLIC PANEL
ALBUMIN: 4 g/dL (ref 3.5–5.0)
ALK PHOS: 92 U/L (ref 38–126)
ALT: 18 U/L (ref 14–54)
AST: 19 U/L (ref 15–41)
Anion gap: 10 (ref 5–15)
BILIRUBIN TOTAL: 0.6 mg/dL (ref 0.3–1.2)
BUN: 14 mg/dL (ref 6–20)
CALCIUM: 8.9 mg/dL (ref 8.9–10.3)
CO2: 24 mmol/L (ref 22–32)
CREATININE: 0.63 mg/dL (ref 0.44–1.00)
Chloride: 106 mmol/L (ref 101–111)
GFR calc Af Amer: >60 mL/min (ref >60)
GFR calc non Af Amer: >60 mL/min (ref >60)
GLUCOSE: 93 mg/dL (ref 65–99)
Potassium: 4.3 mmol/L (ref 3.5–5.1)
SODIUM: 140 mmol/L (ref 135–145)
Total Protein: 7.2 g/dL (ref 6.5–8.1)

## 2017-03-21 MED ORDER — SODIUM CHLORIDE 0.9 % IV SOLN
250.0000 mg | Freq: Once | INTRAVENOUS | Status: AC
  Administered 2017-03-21: 250 mg via INTRAVENOUS
  Filled 2017-03-21: qty 250

## 2017-03-28 ENCOUNTER — Ambulatory Visit
Admission: RE | Admit: 2017-03-28 | Discharge: 2017-03-28 | Disposition: A | Discharge status: Home / Self Care | Payer: BLUE CROSS/BLUE SHIELD | Source: Ambulatory Visit | Attending: Ophthalmology | Admitting: Ophthalmology

## 2017-03-28 DIAGNOSIS — H0520 Unspecified exophthalmos: Secondary | ICD-10-CM | POA: Insufficient documentation

## 2017-03-28 DIAGNOSIS — E0500 Thyrotoxicosis with diffuse goiter without thyrotoxic crisis or storm: Secondary | ICD-10-CM | POA: Insufficient documentation

## 2017-03-28 LAB — COMPREHENSIVE METABOLIC PANEL
ALK PHOS: 99 U/L (ref 38–126)
ALT: 29 U/L (ref 14–54)
AST: 26 U/L (ref 15–41)
Albumin: 3.9 g/dL (ref 3.5–5.0)
Anion gap: 10 (ref 5–15)
BUN: 24 mg/dL — ABNORMAL HIGH (ref 6–20)
CO2: 24 mmol/L (ref 22–32)
Calcium: 9.2 mg/dL (ref 8.9–10.3)
Chloride: 106 mmol/L (ref 101–111)
Creatinine, Ser: 0.52 mg/dL (ref 0.44–1.00)
GFR calc non Af Amer: >60 mL/min (ref >60)
Glucose, Bld: 94 mg/dL (ref 65–99)
Potassium: 4.2 mmol/L (ref 3.5–5.1)
SODIUM: 140 mmol/L (ref 135–145)
TOTAL PROTEIN: 7.1 g/dL (ref 6.5–8.1)
Total Bilirubin: 0.5 mg/dL (ref 0.3–1.2)

## 2017-03-28 MED ORDER — SODIUM CHLORIDE 0.9 % IV SOLN
250.0000 mg | Freq: Once | INTRAVENOUS | Status: AC
  Administered 2017-03-28: 250 mg via INTRAVENOUS
  Filled 2017-03-28: qty 250

## 2017-03-30 ENCOUNTER — Ambulatory Visit (INDEPENDENT_AMBULATORY_CARE_PROVIDER_SITE_OTHER): Payer: BLUE CROSS/BLUE SHIELD | Admitting: Urology

## 2017-03-30 ENCOUNTER — Encounter: Payer: Self-pay | Admitting: Urology

## 2017-03-30 VITALS — BP 122/83 | HR 81 | Ht 66.0 in | Wt 189.0 lb

## 2017-03-30 DIAGNOSIS — R310 Gross hematuria: Secondary | ICD-10-CM

## 2017-03-30 LAB — URINALYSIS, COMPLETE
BILIRUBIN UA: NEGATIVE
GLUCOSE, UA: NEGATIVE
KETONES UA: NEGATIVE
Leukocytes, UA: NEGATIVE
NITRITE UA: NEGATIVE
Protein, UA: NEGATIVE
SPEC GRAV UA: 1.02 (ref 1.005–1.030)
Urobilinogen, Ur: 0.2 mg/dL (ref 0.2–1.0)
pH, UA: 6 (ref 5.0–7.5)

## 2017-03-30 LAB — MICROSCOPIC EXAMINATION
BACTERIA UA: NONE SEEN
RBC MICROSCOPIC, UA: NONE SEEN /HPF (ref 0–?)
WBC, UA: NONE SEEN /hpf (ref 0–?)
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
## 2017-04-01 ENCOUNTER — Ambulatory Visit
Admission: RE | Admit: 2017-04-01 | Discharge: 2017-04-01 | Disposition: A | Discharge status: Home / Self Care | Payer: BLUE CROSS/BLUE SHIELD | Source: Ambulatory Visit | Attending: Internal Medicine | Admitting: Internal Medicine

## 2017-04-01 DIAGNOSIS — Z1231 Encounter for screening mammogram for malignant neoplasm of breast: Secondary | ICD-10-CM | POA: Diagnosis present

## 2017-04-04 ENCOUNTER — Ambulatory Visit
Admission: RE | Admit: 2017-04-04 | Discharge: 2017-04-04 | Disposition: A | Discharge status: Home / Self Care | Payer: BLUE CROSS/BLUE SHIELD | Source: Ambulatory Visit | Attending: Ophthalmology | Admitting: Ophthalmology

## 2017-04-04 DIAGNOSIS — E0500 Thyrotoxicosis with diffuse goiter without thyrotoxic crisis or storm: Secondary | ICD-10-CM | POA: Insufficient documentation

## 2017-04-04 LAB — COMPREHENSIVE METABOLIC PANEL
ALBUMIN: 4 g/dL (ref 3.5–5.0)
ALK PHOS: 87 U/L (ref 38–126)
ALT: 20 U/L (ref 14–54)
AST: 17 U/L (ref 15–41)
Anion gap: 12 (ref 5–15)
BILIRUBIN TOTAL: 0.6 mg/dL (ref 0.3–1.2)
BUN: 18 mg/dL (ref 6–20)
CALCIUM: 8.8 mg/dL — AB (ref 8.9–10.3)
CO2: 22 mmol/L (ref 22–32)
CREATININE: 0.56 mg/dL (ref 0.44–1.00)
Chloride: 107 mmol/L (ref 101–111)
GFR calc Af Amer: >60 mL/min (ref >60)
GFR calc non Af Amer: >60 mL/min (ref >60)
GLUCOSE: 96 mg/dL (ref 65–99)
Potassium: 4.1 mmol/L (ref 3.5–5.1)
Sodium: 141 mmol/L (ref 135–145)
TOTAL PROTEIN: 6.9 g/dL (ref 6.5–8.1)

## 2017-04-04 MED ORDER — SODIUM CHLORIDE 0.9 % IV SOLN
250.0000 mg | Freq: Once | INTRAVENOUS | Status: AC
  Administered 2017-04-04: 250 mg via INTRAVENOUS
  Filled 2017-04-04: qty 2

## 2017-04-06 ENCOUNTER — Other Ambulatory Visit: Payer: Self-pay | Admitting: Family Medicine

## 2017-04-06 DIAGNOSIS — R3129 Other microscopic hematuria: Secondary | ICD-10-CM

## 2017-04-11 ENCOUNTER — Ambulatory Visit
Admission: RE | Admit: 2017-04-11 | Discharge: 2017-04-11 | Disposition: A | Discharge status: Home / Self Care | Payer: BLUE CROSS/BLUE SHIELD | Source: Ambulatory Visit | Attending: Ophthalmology | Admitting: Ophthalmology

## 2017-04-11 DIAGNOSIS — E0500 Thyrotoxicosis with diffuse goiter without thyrotoxic crisis or storm: Secondary | ICD-10-CM | POA: Insufficient documentation

## 2017-04-11 DIAGNOSIS — H0520 Unspecified exophthalmos: Secondary | ICD-10-CM | POA: Insufficient documentation

## 2017-04-11 LAB — COMPREHENSIVE METABOLIC PANEL
ALBUMIN: 4 g/dL (ref 3.5–5.0)
ALT: 13 U/L — ABNORMAL LOW (ref 14–54)
AST: 15 U/L (ref 15–41)
Alkaline Phosphatase: 82 U/L (ref 38–126)
Anion gap: 9 (ref 5–15)
BUN: 16 mg/dL (ref 6–20)
CHLORIDE: 108 mmol/L (ref 101–111)
CO2: 23 mmol/L (ref 22–32)
Calcium: 8.8 mg/dL — ABNORMAL LOW (ref 8.9–10.3)
Creatinine, Ser: 0.58 mg/dL (ref 0.44–1.00)
GFR calc Af Amer: >60 mL/min (ref >60)
Glucose, Bld: 108 mg/dL — ABNORMAL HIGH (ref 65–99)
POTASSIUM: 3.9 mmol/L (ref 3.5–5.1)
SODIUM: 140 mmol/L (ref 135–145)
Total Bilirubin: 0.4 mg/dL (ref 0.3–1.2)
Total Protein: 7.3 g/dL (ref 6.5–8.1)

## 2017-04-11 MED ORDER — SODIUM CHLORIDE 0.9 % IV SOLN
250.0000 mg | Freq: Once | INTRAVENOUS | Status: AC
  Administered 2017-04-11: 250 mg via INTRAVENOUS
  Filled 2017-04-11: qty 2

## 2017-04-14 ENCOUNTER — Ambulatory Visit
Admission: RE | Admit: 2017-04-14 | Discharge: 2017-04-14 | Disposition: A | Discharge status: Home / Self Care | Payer: BLUE CROSS/BLUE SHIELD | Source: Ambulatory Visit | Attending: Urology | Admitting: Urology

## 2017-04-14 DIAGNOSIS — R3129 Other microscopic hematuria: Secondary | ICD-10-CM

## 2017-04-14 DIAGNOSIS — N200 Calculus of kidney: Secondary | ICD-10-CM | POA: Diagnosis not present

## 2017-04-19 ENCOUNTER — Ambulatory Visit (INDEPENDENT_AMBULATORY_CARE_PROVIDER_SITE_OTHER): Payer: BLUE CROSS/BLUE SHIELD | Admitting: Urology

## 2017-04-19 ENCOUNTER — Encounter: Payer: Self-pay | Admitting: Urology

## 2017-04-19 VITALS — BP 131/90 | HR 84 | Ht 66.0 in | Wt 184.2 lb

## 2017-04-19 DIAGNOSIS — R310 Gross hematuria: Secondary | ICD-10-CM

## 2017-04-19 DIAGNOSIS — N200 Calculus of kidney: Secondary | ICD-10-CM

## 2017-04-19 LAB — URINALYSIS, COMPLETE
Bilirubin, UA: NEGATIVE
Glucose, UA: NEGATIVE
Ketones, UA: NEGATIVE
Leukocytes, UA: NEGATIVE
Nitrite, UA: NEGATIVE
PH UA: 6 (ref 5.0–7.5)
PROTEIN UA: NEGATIVE
Specific Gravity, UA: 1.025 (ref 1.005–1.030)
Urobilinogen, Ur: 0.2 mg/dL (ref 0.2–1.0)

## 2017-04-19 LAB — MICROSCOPIC EXAMINATION
BACTERIA UA: NONE SEEN
WBC, UA: NONE SEEN /hpf (ref 0–?)

## 2017-04-19 MED ORDER — CIPROFLOXACIN HCL 500 MG PO TABS
500.0000 mg | ORAL_TABLET | Freq: Once | ORAL | Status: AC
  Administered 2017-04-19: 500 mg via ORAL

## 2017-04-19 MED ORDER — LIDOCAINE HCL 2 % EX GEL
1.0000 "application " | Freq: Once | CUTANEOUS | Status: AC
  Administered 2017-04-19: 1 via URETHRAL
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
## 2018-03-16 ENCOUNTER — Other Ambulatory Visit: Payer: Self-pay | Admitting: Family Medicine

## 2018-03-16 DIAGNOSIS — N200 Calculus of kidney: Secondary | ICD-10-CM

## 2018-04-14 ENCOUNTER — Other Ambulatory Visit: Payer: Self-pay | Admitting: Internal Medicine

## 2018-04-14 DIAGNOSIS — Z1231 Encounter for screening mammogram for malignant neoplasm of breast: Secondary | ICD-10-CM

## 2018-04-17 ENCOUNTER — Ambulatory Visit: Payer: BLUE CROSS/BLUE SHIELD

## 2018-04-19 IMAGING — MG MM DIGITAL SCREENING BILAT W/ CAD
5 series · 5 of 5 positions shown · non-contrast
Comparison: Previous exam(s).

CLINICAL DATA: Screening.

EXAM:
DIGITAL SCREENING BILATERAL MAMMOGRAM WITH CAD

[L CC (1 of 2)]
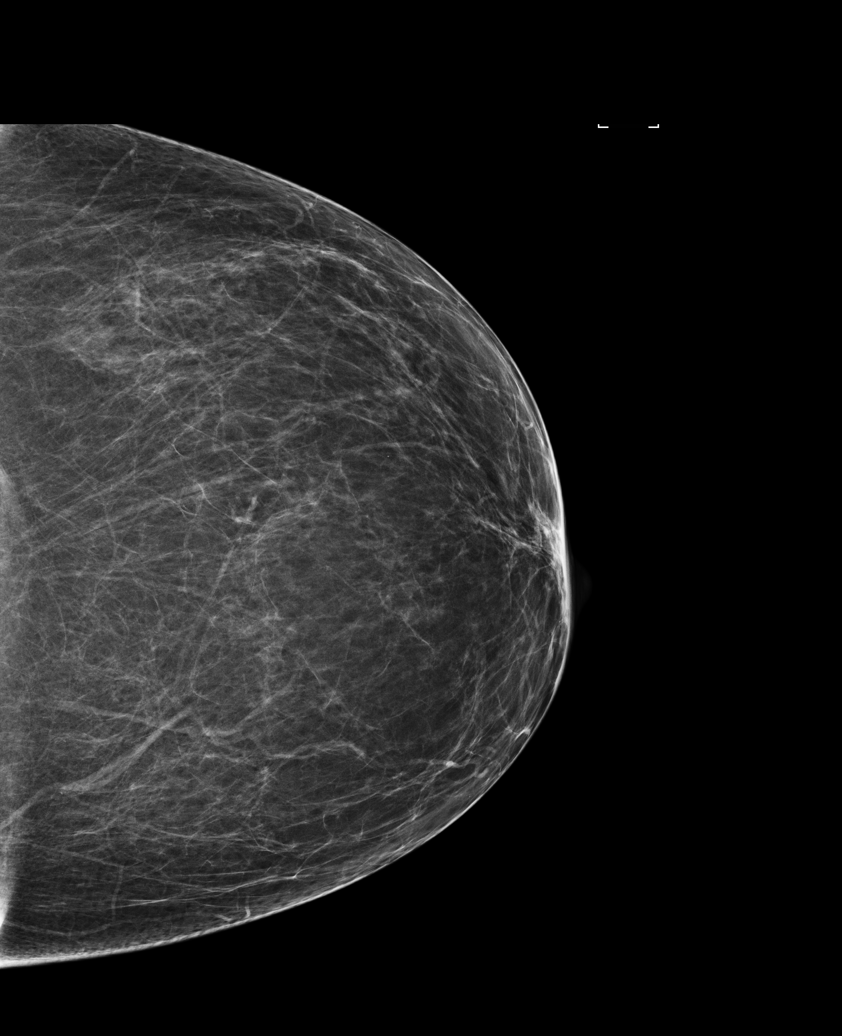

[R CC]
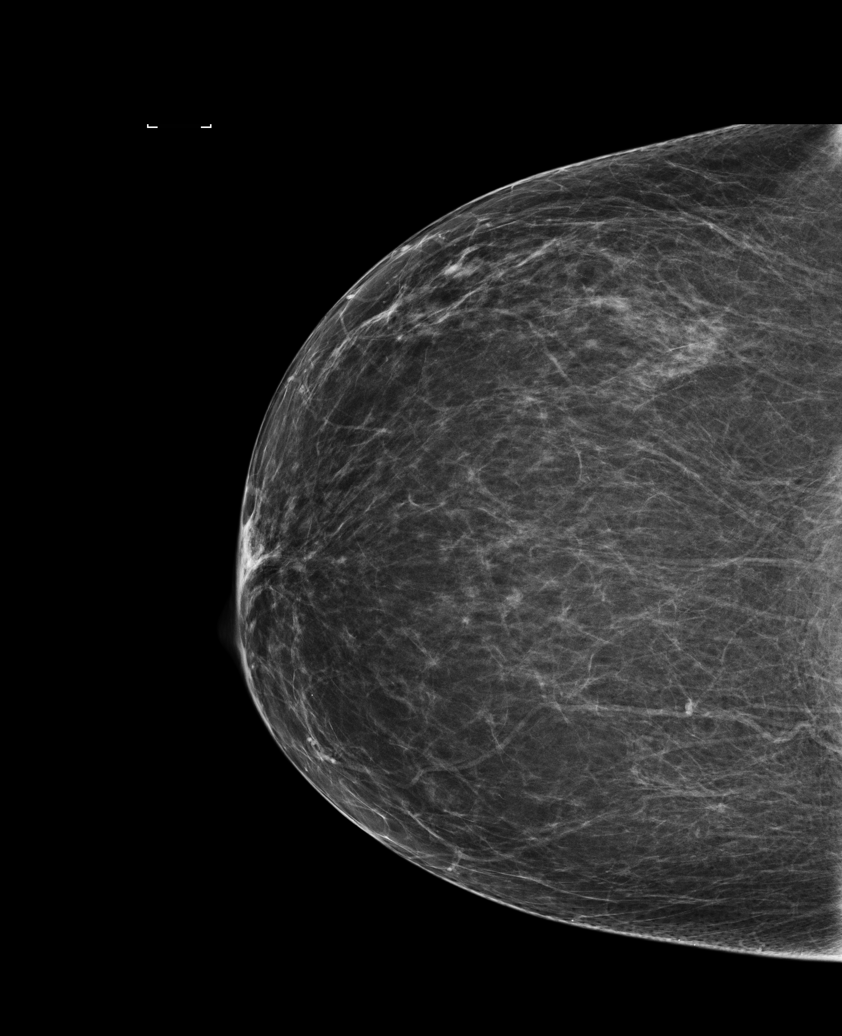

[L MLO]
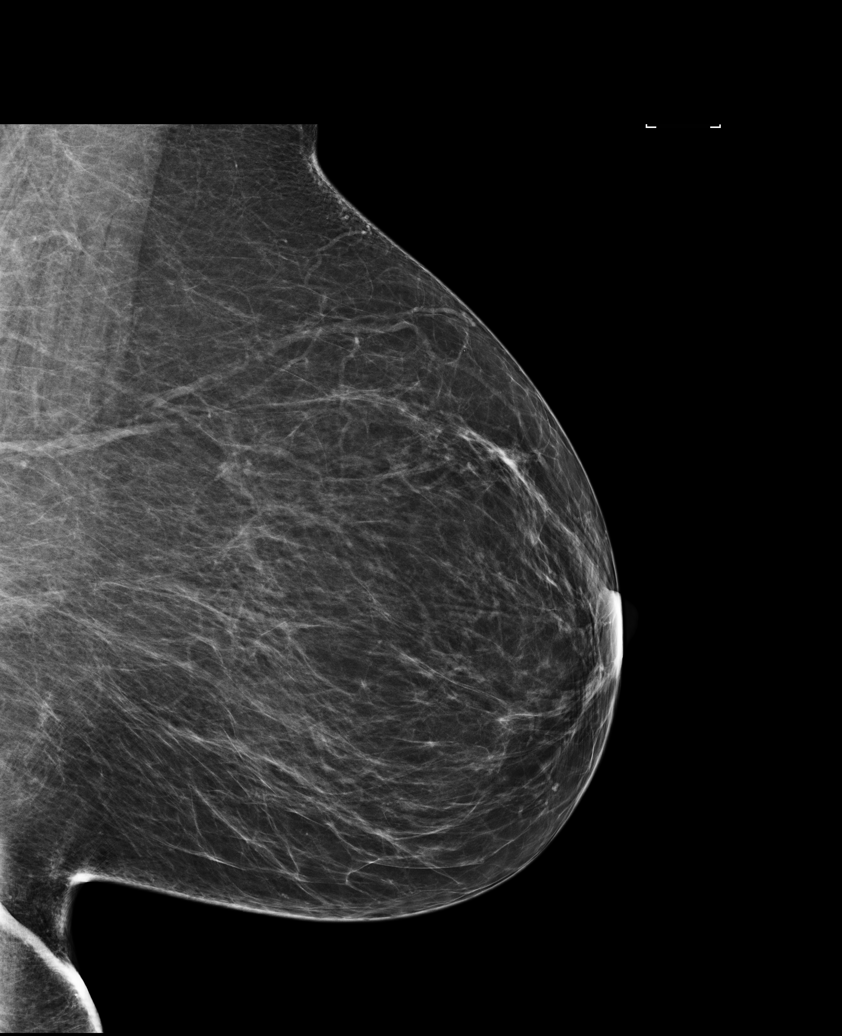

[R MLO]
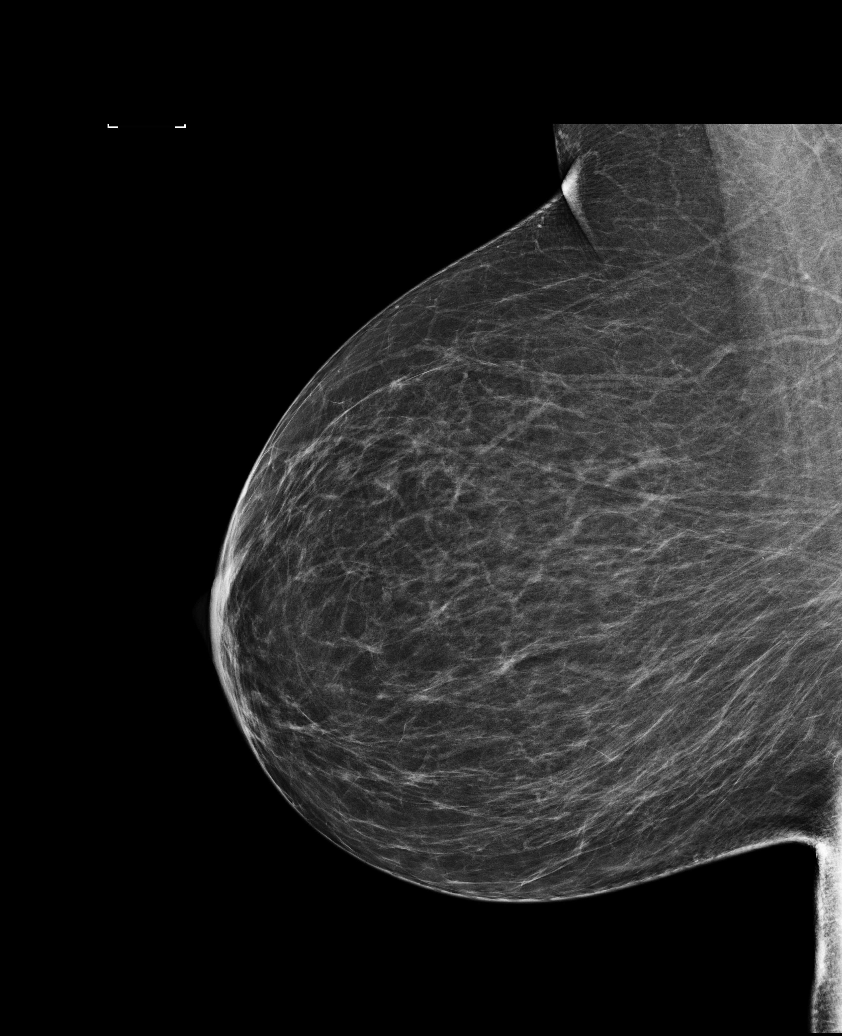

[L CC (2 of 2)]
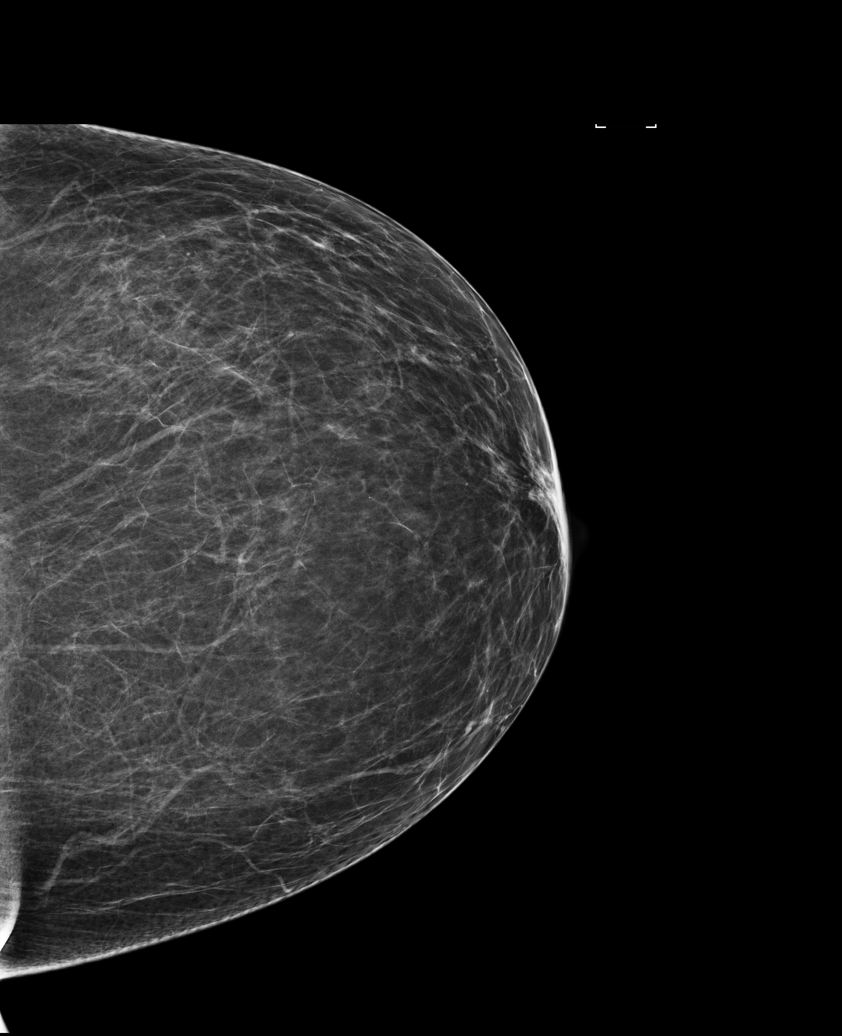

[5 of 5 positions shown; findings below may reference images not displayed]

ACR Breast Density Category b: There are scattered areas of
fibroglandular density.
FINDINGS: There are no findings suspicious for malignancy. Images were
processed with CAD.
IMPRESSION: No mammographic evidence of malignancy. A result letter of this
screening mammogram will be mailed directly to the patient.

RECOMMENDATION:
Screening mammogram in one year. (Code:AS-G-LCT)

BI-RADS CATEGORY  1: Negative.

## 2018-06-27 ENCOUNTER — Ambulatory Visit
Admission: RE | Admit: 2018-06-27 | Discharge: 2018-06-27 | Disposition: A | Discharge status: Home / Self Care | Payer: BLUE CROSS/BLUE SHIELD | Source: Ambulatory Visit | Attending: Urology | Admitting: Urology

## 2018-06-27 DIAGNOSIS — N200 Calculus of kidney: Secondary | ICD-10-CM

## 2018-06-28 ENCOUNTER — Ambulatory Visit: Payer: BLUE CROSS/BLUE SHIELD | Admitting: Urology

## 2018-06-28 ENCOUNTER — Encounter: Payer: Self-pay | Admitting: Urology

## 2018-06-28 VITALS — BP 105/71 | HR 85 | Ht 66.0 in | Wt 164.4 lb

## 2018-06-28 DIAGNOSIS — N390 Urinary tract infection, site not specified: Secondary | ICD-10-CM

## 2018-06-28 DIAGNOSIS — N200 Calculus of kidney: Secondary | ICD-10-CM

## 2018-06-28 DIAGNOSIS — R319 Hematuria, unspecified: Secondary | ICD-10-CM | POA: Diagnosis not present

## 2018-06-28 DIAGNOSIS — R310 Gross hematuria: Secondary | ICD-10-CM

## 2018-06-28 LAB — MICROSCOPIC EXAMINATION

## 2018-06-28 LAB — URINALYSIS, COMPLETE
Bilirubin, UA: NEGATIVE
GLUCOSE, UA: NEGATIVE
KETONES UA: NEGATIVE
Leukocytes, UA: NEGATIVE
NITRITE UA: POSITIVE — AB
Protein, UA: NEGATIVE
SPEC GRAV UA: 1.025 (ref 1.005–1.030)
UUROB: 0.2 mg/dL (ref 0.2–1.0)
pH, UA: 5.5 (ref 5.0–7.5)
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
## 2018-07-02 LAB — CULTURE, URINE COMPREHENSIVE

## 2018-08-24 ENCOUNTER — Other Ambulatory Visit: Payer: Self-pay | Admitting: Internal Medicine

## 2018-08-24 DIAGNOSIS — Z1231 Encounter for screening mammogram for malignant neoplasm of breast: Secondary | ICD-10-CM

## 2019-02-06 ENCOUNTER — Other Ambulatory Visit: Payer: Self-pay

## 2019-02-06 ENCOUNTER — Ambulatory Visit
Admission: RE | Admit: 2019-02-06 | Discharge: 2019-02-06 | Disposition: A | Discharge status: Home / Self Care | Payer: BC Managed Care – PPO | Source: Ambulatory Visit | Attending: Internal Medicine | Admitting: Internal Medicine

## 2019-02-06 DIAGNOSIS — Z1231 Encounter for screening mammogram for malignant neoplasm of breast: Secondary | ICD-10-CM | POA: Diagnosis not present

## 2019-06-29 ENCOUNTER — Ambulatory Visit: Payer: BLUE CROSS/BLUE SHIELD | Admitting: Urology

## 2019-09-03 ENCOUNTER — Other Ambulatory Visit: Payer: Self-pay | Admitting: Family Medicine

## 2019-09-03 DIAGNOSIS — G8929 Other chronic pain: Secondary | ICD-10-CM

## 2019-09-13 ENCOUNTER — Other Ambulatory Visit: Payer: Self-pay

## 2019-09-13 ENCOUNTER — Ambulatory Visit
Admission: RE | Admit: 2019-09-13 | Discharge: 2019-09-13 | Disposition: A | Discharge status: Home / Self Care | Payer: BC Managed Care – PPO | Source: Ambulatory Visit | Attending: Family Medicine | Admitting: Family Medicine

## 2019-09-13 DIAGNOSIS — M5442 Lumbago with sciatica, left side: Secondary | ICD-10-CM | POA: Diagnosis not present

## 2019-09-13 DIAGNOSIS — G8929 Other chronic pain: Secondary | ICD-10-CM | POA: Insufficient documentation

## 2020-06-18 ENCOUNTER — Emergency Department: Payer: BC Managed Care – PPO

## 2020-06-18 ENCOUNTER — Encounter: Payer: Self-pay | Admitting: Emergency Medicine

## 2020-06-18 ENCOUNTER — Other Ambulatory Visit: Payer: Self-pay

## 2020-06-18 DIAGNOSIS — Z5321 Procedure and treatment not carried out due to patient leaving prior to being seen by health care provider: Secondary | ICD-10-CM | POA: Diagnosis not present

## 2020-06-18 DIAGNOSIS — R079 Chest pain, unspecified: Secondary | ICD-10-CM | POA: Insufficient documentation

## 2020-06-18 DIAGNOSIS — R1310 Dysphagia, unspecified: Secondary | ICD-10-CM | POA: Insufficient documentation

## 2020-06-18 LAB — BASIC METABOLIC PANEL
Anion gap: 11 (ref 5–15)
BUN: 18 mg/dL (ref 8–23)
CO2: 28 mmol/L (ref 22–32)
Calcium: 9.5 mg/dL (ref 8.9–10.3)
Chloride: 108 mmol/L (ref 98–111)
Creatinine, Ser: 0.6 mg/dL (ref 0.44–1.00)
GFR, Estimated: >60 mL/min (ref >60)
Glucose, Bld: 116 mg/dL — ABNORMAL HIGH (ref 70–99)
Potassium: 3.9 mmol/L (ref 3.5–5.1)
Sodium: 147 mmol/L — ABNORMAL HIGH (ref 135–145)

## 2020-06-18 LAB — TROPONIN I (HIGH SENSITIVITY): Troponin I (High Sensitivity): 5 ng/L (ref <18)

## 2020-06-18 LAB — CBC
HCT: 44.7 % (ref 36.0–46.0)
Hemoglobin: 14.4 g/dL (ref 12.0–15.0)
MCH: 29.9 pg (ref 26.0–34.0)
MCHC: 32.2 g/dL (ref 30.0–36.0)
MCV: 92.9 fL (ref 80.0–100.0)
Platelets: 399 10*3/uL (ref 150–400)
RBC: 4.81 MIL/uL (ref 3.87–5.11)
RDW: 14.5 % (ref 11.5–15.5)
WBC: 9.7 10*3/uL (ref 4.0–10.5)
nRBC: 0 % (ref 0.0–0.2)

## 2020-06-18 NOTE — ED Triage Notes (Signed)
Pt to ED from home c/o difficulty swallowing since approx 10am today.  Has had this issue in the past, was scheduled for endoscopy last February but not able to go.  Denies SOB, states some pain in the chest like something lodged there.  Pt spitting into cup in triage and states has been all day, speaking in complete sentences.

## 2020-06-19 ENCOUNTER — Emergency Department
Admission: EM | Admit: 2020-06-19 | Discharge: 2020-06-19 | Disposition: A | Discharge status: Home / Self Care | Payer: BC Managed Care – PPO | Attending: Emergency Medicine | Admitting: Emergency Medicine

## 2020-06-19 LAB — TROPONIN I (HIGH SENSITIVITY): Troponin I (High Sensitivity): 5 ng/L (ref <18)

## 2020-08-04 ENCOUNTER — Other Ambulatory Visit: Payer: Self-pay | Admitting: Internal Medicine

## 2020-08-04 DIAGNOSIS — Z1231 Encounter for screening mammogram for malignant neoplasm of breast: Secondary | ICD-10-CM

## 2020-08-26 ENCOUNTER — Ambulatory Visit
Admission: RE | Admit: 2020-08-26 | Discharge: 2020-08-26 | Disposition: A | Discharge status: Home / Self Care | Payer: BC Managed Care – PPO | Source: Ambulatory Visit | Attending: Internal Medicine | Admitting: Internal Medicine

## 2020-08-26 ENCOUNTER — Other Ambulatory Visit: Payer: Self-pay

## 2020-08-26 DIAGNOSIS — Z1231 Encounter for screening mammogram for malignant neoplasm of breast: Secondary | ICD-10-CM | POA: Insufficient documentation

## 2021-04-22 ENCOUNTER — Observation Stay: Payer: Medicare Other

## 2021-04-22 ENCOUNTER — Other Ambulatory Visit: Payer: Self-pay

## 2021-04-22 ENCOUNTER — Observation Stay
Admit: 2021-04-22 | Discharge: 2021-04-22 | Disposition: A | Discharge status: Home / Self Care | Payer: Medicare Other | Attending: Internal Medicine | Admitting: Internal Medicine

## 2021-04-22 ENCOUNTER — Observation Stay
Admission: EM | Admit: 2021-04-22 | Discharge: 2021-04-23 | Disposition: A | Discharge status: Home / Self Care | Payer: Medicare Other | Attending: Internal Medicine | Admitting: Internal Medicine

## 2021-04-22 DIAGNOSIS — I471 Supraventricular tachycardia, unspecified: Secondary | ICD-10-CM | POA: Diagnosis present

## 2021-04-22 DIAGNOSIS — Z20822 Contact with and (suspected) exposure to covid-19: Secondary | ICD-10-CM | POA: Insufficient documentation

## 2021-04-22 DIAGNOSIS — R748 Abnormal levels of other serum enzymes: Secondary | ICD-10-CM | POA: Insufficient documentation

## 2021-04-22 DIAGNOSIS — Z79890 Hormone replacement therapy (postmenopausal): Secondary | ICD-10-CM | POA: Insufficient documentation

## 2021-04-22 DIAGNOSIS — E039 Hypothyroidism, unspecified: Secondary | ICD-10-CM

## 2021-04-22 DIAGNOSIS — E785 Hyperlipidemia, unspecified: Secondary | ICD-10-CM | POA: Diagnosis not present

## 2021-04-22 DIAGNOSIS — R002 Palpitations: Secondary | ICD-10-CM | POA: Diagnosis present

## 2021-04-22 DIAGNOSIS — I10 Essential (primary) hypertension: Secondary | ICD-10-CM | POA: Diagnosis not present

## 2021-04-22 DIAGNOSIS — R600 Localized edema: Secondary | ICD-10-CM | POA: Diagnosis not present

## 2021-04-22 DIAGNOSIS — Z7984 Long term (current) use of oral hypoglycemic drugs: Secondary | ICD-10-CM | POA: Insufficient documentation

## 2021-04-22 DIAGNOSIS — M7989 Other specified soft tissue disorders: Secondary | ICD-10-CM

## 2021-04-22 DIAGNOSIS — R778 Other specified abnormalities of plasma proteins: Secondary | ICD-10-CM | POA: Diagnosis present

## 2021-04-22 DIAGNOSIS — Z79899 Other long term (current) drug therapy: Secondary | ICD-10-CM | POA: Diagnosis not present

## 2021-04-22 DIAGNOSIS — R531 Weakness: Secondary | ICD-10-CM | POA: Diagnosis present

## 2021-04-22 DIAGNOSIS — R42 Dizziness and giddiness: Secondary | ICD-10-CM | POA: Diagnosis present

## 2021-04-22 DIAGNOSIS — E119 Type 2 diabetes mellitus without complications: Secondary | ICD-10-CM

## 2021-04-22 DIAGNOSIS — R7989 Other specified abnormal findings of blood chemistry: Secondary | ICD-10-CM | POA: Diagnosis present

## 2021-04-22 LAB — CBC WITH DIFFERENTIAL/PLATELET
Abs Immature Granulocytes: 0.05 10*3/uL (ref 0.00–0.07)
Basophils Absolute: 0.1 10*3/uL (ref 0.0–0.1)
Basophils Relative: 1 %
Eosinophils Absolute: 0.2 10*3/uL (ref 0.0–0.5)
Eosinophils Relative: 1 %
HCT: 43.2 % (ref 36.0–46.0)
Hemoglobin: 13.9 g/dL (ref 12.0–15.0)
Immature Granulocytes: 0 %
Lymphocytes Relative: 33 %
Lymphs Abs: 4.3 10*3/uL — ABNORMAL HIGH (ref 0.7–4.0)
MCH: 29.4 pg (ref 26.0–34.0)
MCHC: 32.2 g/dL (ref 30.0–36.0)
MCV: 91.5 fL (ref 80.0–100.0)
Monocytes Absolute: 1.6 10*3/uL — ABNORMAL HIGH (ref 0.1–1.0)
Monocytes Relative: 12 %
Neutro Abs: 7 10*3/uL (ref 1.7–7.7)
Neutrophils Relative %: 53 %
Platelets: 425 10*3/uL — ABNORMAL HIGH (ref 150–400)
RBC: 4.72 MIL/uL (ref 3.87–5.11)
RDW: 14.3 % (ref 11.5–15.5)
Smear Review: NORMAL
WBC: 13.1 10*3/uL — ABNORMAL HIGH (ref 4.0–10.5)
nRBC: 0 % (ref 0.0–0.2)

## 2021-04-22 LAB — BASIC METABOLIC PANEL
Anion gap: 9 (ref 5–15)
BUN: 23 mg/dL (ref 8–23)
CO2: 24 mmol/L (ref 22–32)
Calcium: 8.8 mg/dL — ABNORMAL LOW (ref 8.9–10.3)
Chloride: 107 mmol/L (ref 98–111)
Creatinine, Ser: 1.05 mg/dL — ABNORMAL HIGH (ref 0.44–1.00)
GFR, Estimated: 59 mL/min — ABNORMAL LOW (ref >60)
Glucose, Bld: 159 mg/dL — ABNORMAL HIGH (ref 70–99)
Potassium: 4.1 mmol/L (ref 3.5–5.1)
Sodium: 140 mmol/L (ref 135–145)

## 2021-04-22 LAB — PROTIME-INR
INR: 1 (ref 0.8–1.2)
Prothrombin Time: 12.9 seconds (ref 11.4–15.2)

## 2021-04-22 LAB — TROPONIN I (HIGH SENSITIVITY)
Troponin I (High Sensitivity): 56 ng/L — ABNORMAL HIGH (ref <18)
Troponin I (High Sensitivity): 819 ng/L
Troponin I (High Sensitivity): 1507 ng/L
Troponin I (High Sensitivity): 1700 ng/L (ref <18)
Troponin I (High Sensitivity): 1602 ng/L (ref <18)

## 2021-04-22 LAB — LIPID PANEL
Cholesterol: 203 mg/dL — ABNORMAL HIGH (ref 0–200)
HDL: 49 mg/dL (ref >40)
LDL Cholesterol: 126 mg/dL — ABNORMAL HIGH (ref 0–99)
Total CHOL/HDL Ratio: 4.1 RATIO
Triglycerides: 138 mg/dL (ref <150)
VLDL: 28 mg/dL (ref 0–40)

## 2021-04-22 LAB — CBG MONITORING, ED
Glucose-Capillary: 93 mg/dL (ref 70–99)
Glucose-Capillary: 99 mg/dL (ref 70–99)

## 2021-04-22 LAB — TSH: TSH: 5.836 u[IU]/mL — ABNORMAL HIGH (ref 0.350–4.500)

## 2021-04-22 LAB — HEMOGLOBIN A1C
Hgb A1c MFr Bld: 6.2 % — ABNORMAL HIGH (ref 4.8–5.6)
Mean Plasma Glucose: 131.24 mg/dL

## 2021-04-22 LAB — T4, FREE: Free T4: 0.85 ng/dL (ref 0.61–1.12)

## 2021-04-22 LAB — MAGNESIUM: Magnesium: 2.3 mg/dL (ref 1.7–2.4)

## 2021-04-22 LAB — HIV ANTIBODY (ROUTINE TESTING W REFLEX): HIV Screen 4th Generation wRfx: NONREACTIVE

## 2021-04-22 LAB — RESP PANEL BY RT-PCR (FLU A&B, COVID) ARPGX2
Influenza A by PCR: NEGATIVE
Influenza B by PCR: NEGATIVE
SARS Coronavirus 2 by RT PCR: NEGATIVE

## 2021-04-22 LAB — APTT: aPTT: 24 seconds (ref 24–36)

## 2021-04-22 MED ORDER — ADENOSINE 6 MG/2ML IV SOLN
6.0000 mg | Freq: Once | INTRAVENOUS | Status: AC
  Administered 2021-04-22: 6 mg via INTRAVENOUS

## 2021-04-22 MED ORDER — EZETIMIBE 10 MG PO TABS
10.0000 mg | ORAL_TABLET | Freq: Every day | ORAL | Status: DC
  Filled 2021-04-22 (×2): qty 1

## 2021-04-22 MED ORDER — METOPROLOL TARTRATE 25 MG PO TABS: 25.0000 mg | ORAL_TABLET | Freq: Two times a day (BID) | ORAL | 0 refills | Status: DC

## 2021-04-22 MED ORDER — ONDANSETRON HCL 4 MG PO TABS: 4.0000 mg | ORAL_TABLET | Freq: Four times a day (QID) | ORAL | Status: DC | PRN

## 2021-04-22 MED ORDER — DIGESTIVE ENZYME PO CAPS: 1.0000 | ORAL_CAPSULE | Freq: Two times a day (BID) | ORAL | Status: DC

## 2021-04-22 MED ORDER — SODIUM CHLORIDE 0.9 % IV BOLUS
1000.0000 mL | Freq: Once | INTRAVENOUS | Status: AC
  Administered 2021-04-22: 1000 mL via INTRAVENOUS

## 2021-04-22 MED ORDER — LEVOTHYROXINE SODIUM 112 MCG PO TABS
112.0000 ug | ORAL_TABLET | Freq: Every day | ORAL | Status: DC
Start: 2021-04-23 — End: 2021-04-23
  Administered 2021-04-23: 112 ug via ORAL
  Filled 2021-04-22: qty 1

## 2021-04-22 MED ORDER — ADENOSINE 12 MG/4ML IV SOLN
INTRAVENOUS | Status: AC
  Filled 2021-04-22: qty 4

## 2021-04-22 MED ORDER — ONDANSETRON HCL 4 MG/2ML IJ SOLN: 4.0000 mg | Freq: Four times a day (QID) | INTRAMUSCULAR | Status: DC | PRN

## 2021-04-22 MED ORDER — ADENOSINE 6 MG/2ML IV SOLN
INTRAVENOUS | Status: AC
  Filled 2021-04-22: qty 2

## 2021-04-22 MED ORDER — ACETAMINOPHEN 650 MG RE SUPP: 650.0000 mg | Freq: Four times a day (QID) | RECTAL | Status: DC | PRN

## 2021-04-22 MED ORDER — ASPIRIN 81 MG PO CHEW
324.0000 mg | CHEWABLE_TABLET | Freq: Once | ORAL | Status: AC
  Administered 2021-04-22: 324 mg via ORAL
  Filled 2021-04-22: qty 4

## 2021-04-22 MED ORDER — METFORMIN HCL 500 MG PO TABS
500.0000 mg | ORAL_TABLET | Freq: Two times a day (BID) | ORAL | Status: DC
  Administered 2021-04-22 – 2021-04-23 (×2): 500 mg via ORAL
  Filled 2021-04-22 (×2): qty 1

## 2021-04-22 MED ORDER — ASPIRIN EC 81 MG PO TBEC
81.0000 mg | DELAYED_RELEASE_TABLET | Freq: Every day | ORAL | Status: DC
  Administered 2021-04-23: 81 mg via ORAL
  Filled 2021-04-22: qty 1

## 2021-04-22 MED ORDER — INSULIN ASPART 100 UNIT/ML IJ SOLN: 0.0000 [IU] | Freq: Three times a day (TID) | INTRAMUSCULAR | Status: DC

## 2021-04-22 MED ORDER — PANTOPRAZOLE SODIUM 40 MG PO TBEC
40.0000 mg | DELAYED_RELEASE_TABLET | Freq: Every day | ORAL | Status: DC
  Administered 2021-04-22 – 2021-04-23 (×2): 40 mg via ORAL
  Filled 2021-04-22 (×2): qty 1

## 2021-04-22 MED ORDER — AMLODIPINE BESYLATE 5 MG PO TABS
2.5000 mg | ORAL_TABLET | Freq: Every day | ORAL | Status: DC
  Administered 2021-04-23: 2.5 mg via ORAL
  Filled 2021-04-22: qty 1

## 2021-04-22 MED ORDER — ACETAMINOPHEN 325 MG PO TABS
650.0000 mg | ORAL_TABLET | Freq: Four times a day (QID) | ORAL | Status: DC | PRN
  Administered 2021-04-22 – 2021-04-23 (×2): 650 mg via ORAL
  Filled 2021-04-22 (×2): qty 2

## 2021-04-22 MED ORDER — HEPARIN SODIUM (PORCINE) 5000 UNIT/ML IJ SOLN
5000.0000 [IU] | Freq: Three times a day (TID) | INTRAMUSCULAR | Status: DC
  Administered 2021-04-22 – 2021-04-23 (×2): 5000 [IU] via SUBCUTANEOUS
  Filled 2021-04-22 (×2): qty 1

## 2021-04-22 MED ORDER — RISAQUAD PO CAPS
1.0000 | ORAL_CAPSULE | Freq: Every day | ORAL | Status: DC
  Administered 2021-04-22 – 2021-04-23 (×2): 1 via ORAL
  Filled 2021-04-22 (×2): qty 1

## 2021-04-22 MED ORDER — METOPROLOL SUCCINATE ER 50 MG PO TB24
25.0000 mg | ORAL_TABLET | Freq: Every day | ORAL | Status: DC
  Administered 2021-04-23: 25 mg via ORAL
  Filled 2021-04-22: qty 1

## 2021-04-22 MED ORDER — INSULIN ASPART 100 UNIT/ML IJ SOLN: 0.0000 [IU] | Freq: Every day | INTRAMUSCULAR | Status: DC

## 2021-04-22 MED ORDER — LEVOTHYROXINE SODIUM 112 MCG PO TABS: 112.0000 ug | ORAL_TABLET | Freq: Every day | ORAL | Status: DC

## 2021-04-22 MED ORDER — METOPROLOL TARTRATE 25 MG PO TABS
25.0000 mg | ORAL_TABLET | Freq: Once | ORAL | Status: AC
  Administered 2021-04-22: 25 mg via ORAL
  Filled 2021-04-22: qty 1
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
## 2021-04-22 NOTE — ED Triage Notes (Signed)
Pt to triage via w/c, with generalized weakness noted; st awoke at 230 to go to BR and felt dizzy, "pounding" to rt side of neck; denies hx of same; denies any recent illness; pt noted to have hypotension and tachycardia on dinamap; taken immed to room 13; assisted into hosp gown & on card monitor; charge nurse and Dr Scotty Court called to room; O2 at 2l/min via [place] applied; zoll monitor to bedside and defib pads placed

## 2021-04-22 NOTE — ED Notes (Signed)
Dr. Lenard Lance notified of critical Troponin of 819.

## 2021-04-22 NOTE — ED Provider Notes (Addendum)
-----------------------------------------   10:56 AM on 04/22/2021 ----------------------------------------- Patient care assumed from Dr. Scotty Court.  Patient's initial troponin is 56.  They had to repeat the second troponin multiple times due to a machine malfunction.  They finally resulted a verified troponin of 819.  Given the patient's significant troponin elevation patient will require admission to the hospital service we will cover with heparin as well.  Patient denies any symptoms.  No chest pain currently.   Final diagnosis: Elevated troponin SVT    [name], MD 04/22/21 1058    [name], MD 04/22/21 1058   Patient had an elevated troponin from 56-819 and the decision was made by myself to cover with heparin as a precaution for possible NSTEMI.  Per pharmacy PT/INR as well as PTT is required prior to pharmacy ordering the correct heparin dosage.  Hence the PTT order.   [name], MD 05/23/21 1221

## 2021-04-22 NOTE — ED Triage Notes (Signed)
Pt came in to ED per POV, for dizziness since 2AM today. Noted w/ hypotension and tachycardia on arrival. Presents to ED AAOx4,. Respi even-unlabored. HR=195. ERMD at bedside at this time

## 2021-04-23 DIAGNOSIS — I471 Supraventricular tachycardia: Secondary | ICD-10-CM | POA: Diagnosis not present

## 2021-04-23 LAB — CBC
HCT: 44.5 % (ref 36.0–46.0)
Hemoglobin: 14.2 g/dL (ref 12.0–15.0)
MCH: 29.7 pg (ref 26.0–34.0)
MCHC: 31.9 g/dL (ref 30.0–36.0)
MCV: 93.1 fL (ref 80.0–100.0)
Platelets: 385 10*3/uL (ref 150–400)
RBC: 4.78 MIL/uL (ref 3.87–5.11)
RDW: 14.2 % (ref 11.5–15.5)
WBC: 8.2 10*3/uL (ref 4.0–10.5)
nRBC: 0 % (ref 0.0–0.2)

## 2021-04-23 LAB — URINALYSIS, COMPLETE (UACMP) WITH MICROSCOPIC
Bacteria, UA: NONE SEEN
Bilirubin Urine: NEGATIVE
Glucose, UA: NEGATIVE mg/dL
Ketones, ur: NEGATIVE mg/dL
Nitrite: NEGATIVE
Protein, ur: NEGATIVE mg/dL
Specific Gravity, Urine: 1.011 (ref 1.005–1.030)
pH: 6 (ref 5.0–8.0)

## 2021-04-23 LAB — CBG MONITORING, ED
Glucose-Capillary: 113 mg/dL — ABNORMAL HIGH (ref 70–99)
Glucose-Capillary: 87 mg/dL (ref 70–99)

## 2021-04-23 LAB — BASIC METABOLIC PANEL
Anion gap: 9 (ref 5–15)
BUN: 16 mg/dL (ref 8–23)
CO2: 25 mmol/L (ref 22–32)
Calcium: 9.1 mg/dL (ref 8.9–10.3)
Chloride: 106 mmol/L (ref 98–111)
Creatinine, Ser: 0.67 mg/dL (ref 0.44–1.00)
GFR, Estimated: >60 mL/min (ref >60)
Glucose, Bld: 117 mg/dL — ABNORMAL HIGH (ref 70–99)
Potassium: 4.2 mmol/L (ref 3.5–5.1)
Sodium: 140 mmol/L (ref 135–145)

## 2021-04-23 MED ORDER — METOPROLOL SUCCINATE ER 25 MG PO TB24: 25.0000 mg | ORAL_TABLET | Freq: Every day | ORAL | 3 refills | Status: DC

## 2021-04-23 MED ORDER — ASPIRIN 81 MG PO TBEC: 81.0000 mg | DELAYED_RELEASE_TABLET | Freq: Every day | ORAL | 11 refills | Status: AC

## 2021-04-23 NOTE — Progress Notes (Signed)
IV removed from patient. Discharge instructions given to patient and son. Verbalized understanding. No acute distress at this time. Son will transport patient home.
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
## 2021-05-05 ENCOUNTER — Encounter
Admission: RE | Disposition: A | Discharge status: Home / Self Care | Payer: Self-pay | Source: Home / Self Care | Attending: Cardiology

## 2021-05-05 ENCOUNTER — Encounter: Payer: Self-pay | Admitting: Cardiology

## 2021-05-05 ENCOUNTER — Ambulatory Visit
Admission: RE | Admit: 2021-05-05 | Discharge: 2021-05-05 | Disposition: A | Discharge status: Home / Self Care | Payer: Medicare Other | Attending: Cardiology | Admitting: Cardiology

## 2021-05-05 DIAGNOSIS — I2510 Atherosclerotic heart disease of native coronary artery without angina pectoris: Secondary | ICD-10-CM | POA: Insufficient documentation

## 2021-05-05 DIAGNOSIS — I2582 Chronic total occlusion of coronary artery: Secondary | ICD-10-CM | POA: Insufficient documentation

## 2021-05-05 DIAGNOSIS — E785 Hyperlipidemia, unspecified: Secondary | ICD-10-CM | POA: Insufficient documentation

## 2021-05-05 DIAGNOSIS — I10 Essential (primary) hypertension: Secondary | ICD-10-CM | POA: Diagnosis not present

## 2021-05-05 DIAGNOSIS — E039 Hypothyroidism, unspecified: Secondary | ICD-10-CM | POA: Diagnosis not present

## 2021-05-05 DIAGNOSIS — E119 Type 2 diabetes mellitus without complications: Secondary | ICD-10-CM | POA: Diagnosis not present

## 2021-05-05 HISTORY — PX: INTRAVASCULAR PRESSURE WIRE/FFR STUDY: CATH118243

## 2021-05-05 HISTORY — PX: LEFT HEART CATH AND CORONARY ANGIOGRAPHY: CATH118249

## 2021-05-05 LAB — ECHOCARDIOGRAM COMPLETE
Area-P 1/2: 2.54 cm2
Height: 66 in
S' Lateral: 2.88 cm
Weight: 3046.4 oz

## 2021-05-05 SURGERY — LEFT HEART CATH AND CORONARY ANGIOGRAPHY
Anesthesia: Moderate Sedation

## 2021-05-05 MED ORDER — FENTANYL CITRATE (PF) 100 MCG/2ML IJ SOLN
INTRAMUSCULAR | Status: DC | PRN
  Administered 2021-05-05: 25 ug via INTRAVENOUS

## 2021-05-05 MED ORDER — LIDOCAINE HCL 1 % IJ SOLN
INTRAMUSCULAR | Status: AC
  Filled 2021-05-05: qty 20

## 2021-05-05 MED ORDER — SODIUM CHLORIDE 0.9% FLUSH: 3.0000 mL | INTRAVENOUS | Status: DC | PRN

## 2021-05-05 MED ORDER — DIPHENHYDRAMINE HCL 50 MG/ML IJ SOLN
INTRAMUSCULAR | Status: AC
  Filled 2021-05-05: qty 1

## 2021-05-05 MED ORDER — VERAPAMIL HCL 2.5 MG/ML IV SOLN
INTRAVENOUS | Status: DC | PRN
  Administered 2021-05-05: 2.5 mg via INTRA_ARTERIAL

## 2021-05-05 MED ORDER — FAMOTIDINE 20 MG PO TABS
ORAL_TABLET | ORAL | Status: DC | PRN
  Administered 2021-05-05: 20 mg via ORAL

## 2021-05-05 MED ORDER — SODIUM CHLORIDE 0.9 % IV SOLN: 250.0000 mL | INTRAVENOUS | Status: DC | PRN

## 2021-05-05 MED ORDER — ACETAMINOPHEN 325 MG PO TABS: 650.0000 mg | ORAL_TABLET | ORAL | Status: DC | PRN

## 2021-05-05 MED ORDER — ASPIRIN 81 MG PO CHEW
CHEWABLE_TABLET | ORAL | Status: AC
  Filled 2021-05-05: qty 4

## 2021-05-05 MED ORDER — LIDOCAINE HCL (PF) 1 % IJ SOLN
INTRAMUSCULAR | Status: DC | PRN
  Administered 2021-05-05: 1 mL via SUBCUTANEOUS

## 2021-05-05 MED ORDER — DIPHENHYDRAMINE HCL 50 MG/ML IJ SOLN
INTRAMUSCULAR | Status: DC | PRN
  Administered 2021-05-05: 25 mg via INTRAVENOUS

## 2021-05-05 MED ORDER — HEPARIN SODIUM (PORCINE) 1000 UNIT/ML IJ SOLN
INTRAMUSCULAR | Status: AC
  Filled 2021-05-05: qty 1

## 2021-05-05 MED ORDER — SODIUM CHLORIDE 0.9% FLUSH: 3.0000 mL | Freq: Two times a day (BID) | INTRAVENOUS | Status: DC

## 2021-05-05 MED ORDER — SODIUM CHLORIDE 0.9 % IV SOLN
INTRAVENOUS | Status: DC
  Administered 2021-05-05: 1000 mL via INTRAVENOUS

## 2021-05-05 MED ORDER — ASPIRIN 81 MG PO CHEW
324.0000 mg | CHEWABLE_TABLET | ORAL | Status: AC
  Administered 2021-05-05: 324 mg via ORAL

## 2021-05-05 MED ORDER — MIDAZOLAM HCL 2 MG/2ML IJ SOLN
INTRAMUSCULAR | Status: AC
  Filled 2021-05-05: qty 2

## 2021-05-05 MED ORDER — FENTANYL CITRATE (PF) 100 MCG/2ML IJ SOLN
INTRAMUSCULAR | Status: AC
  Filled 2021-05-05: qty 2

## 2021-05-05 MED ORDER — VERAPAMIL HCL 2.5 MG/ML IV SOLN
INTRAVENOUS | Status: AC
  Filled 2021-05-05: qty 2

## 2021-05-05 MED ORDER — FAMOTIDINE 20 MG PO TABS
ORAL_TABLET | ORAL | Status: AC
  Filled 2021-05-05: qty 1

## 2021-05-05 MED ORDER — HEPARIN (PORCINE) IN NACL 1000-0.9 UT/500ML-% IV SOLN
INTRAVENOUS | Status: AC
  Filled 2021-05-05: qty 1000

## 2021-05-05 MED ORDER — HEPARIN (PORCINE) IN NACL 1000-0.9 UT/500ML-% IV SOLN
INTRAVENOUS | Status: DC | PRN
  Administered 2021-05-05: 1000 mL

## 2021-05-05 MED ORDER — MIDAZOLAM HCL 2 MG/2ML IJ SOLN
INTRAMUSCULAR | Status: DC | PRN
  Administered 2021-05-05: .5 mg via INTRAVENOUS

## 2021-05-05 MED ORDER — IOHEXOL 350 MG/ML SOLN
INTRAVENOUS | Status: DC | PRN
  Administered 2021-05-05: 70 mL

## 2021-05-05 MED ORDER — HEPARIN SODIUM (PORCINE) 1000 UNIT/ML IJ SOLN
INTRAMUSCULAR | Status: DC | PRN
  Administered 2021-05-05: 4500 [IU] via INTRAVENOUS
  Administered 2021-05-05: 4000 [IU] via INTRAVENOUS

## 2021-05-05 MED ORDER — ONDANSETRON HCL 4 MG/2ML IJ SOLN: 4.0000 mg | Freq: Four times a day (QID) | INTRAMUSCULAR | Status: DC | PRN

## 2021-05-05 SURGICAL SUPPLY — 15 items
CATH INFINITI 5 FR JL3.5 (CATHETERS) ×1 IMPLANT
CATH INFINITI JR4 5F (CATHETERS) ×1 IMPLANT
CATH LAUNCHER 6FR JR4 (CATHETERS) ×1 IMPLANT
DEVICE RAD TR BAND REGULAR (VASCULAR PRODUCTS) ×1 IMPLANT
DRAPE BRACHIAL (DRAPES) ×1 IMPLANT
GLIDESHEATH SLEND SS 6F .021 (SHEATH) ×1 IMPLANT
GUIDEWIRE INQWIRE 1.5J.035X260 (WIRE) IMPLANT
GUIDEWIRE PRESS OMNI 185 ST (WIRE) ×1 IMPLANT
INQWIRE 1.5J .035X260CM (WIRE) ×3
KIT ENCORE 26 ADVANTAGE (KITS) ×1 IMPLANT
PACK CARDIAC CATH (CUSTOM PROCEDURE TRAY) ×3 IMPLANT
PROTECTION STATION PRESSURIZED (MISCELLANEOUS) ×3
SET ATX SIMPLICITY (MISCELLANEOUS) ×1 IMPLANT
STATION PROTECTION PRESSURIZED (MISCELLANEOUS) IMPLANT
TUBING CIL FLEX 10 FLL-RA (TUBING) ×1 IMPLANT
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
## 2021-05-22 HISTORY — PX: OTHER SURGICAL HISTORY: SHX169

## 2021-06-23 ENCOUNTER — Encounter: Payer: Medicare Other | Attending: Cardiology | Admitting: *Deleted

## 2021-06-23 ENCOUNTER — Other Ambulatory Visit: Payer: Self-pay

## 2021-06-23 DIAGNOSIS — I214 Non-ST elevation (NSTEMI) myocardial infarction: Secondary | ICD-10-CM | POA: Insufficient documentation

## 2021-06-23 DIAGNOSIS — Z955 Presence of coronary angioplasty implant and graft: Secondary | ICD-10-CM | POA: Insufficient documentation

## 2021-06-23 NOTE — Progress Notes (Signed)
Virtual orientation call completed today. shehas an appointment on Date: 07/07/2021  for EP eval and gym Orientation.  Documentation of diagnosis can be found in CL  Date: 05/05/21 and 05/22/2021 .

## 2021-07-07 ENCOUNTER — Other Ambulatory Visit: Payer: Self-pay

## 2021-07-07 VITALS — Ht 66.0 in | Wt 191.9 lb

## 2021-07-07 DIAGNOSIS — Z955 Presence of coronary angioplasty implant and graft: Secondary | ICD-10-CM

## 2021-07-07 DIAGNOSIS — I214 Non-ST elevation (NSTEMI) myocardial infarction: Secondary | ICD-10-CM

## 2021-07-07 NOTE — Patient Instructions (Signed)
Patient Instructions  Patient Details  Name: NONIE LOCHNER MRN: [id number] Date of Birth: 1956/04/03 Referring Provider:  Armando Reichert, MD  Below are your personal goals for exercise, nutrition, and risk factors. Our goal is to help you stay on track towards obtaining and maintaining these goals. We will be discussing your progress on these goals with you throughout the program.  Initial Exercise Prescription:  Initial Exercise Prescription - 07/07/21 1400       Date of Initial Exercise RX and Referring Provider   Date 07/07/21    Referring Provider Sena Slate MD      Oxygen   Maintain Oxygen Saturation 88% or higher      Recumbant Bike   Level 2    RPM 60    Watts 25    Minutes 15    METs 2.6      REL-XR   Level 2    Speed 50    Minutes 15    METs 2.6      Track   Laps 26    Minutes 15    METs 2.41      Prescription Details   Frequency (times per week) 3    Duration Progress to 30 minutes of continuous aerobic without signs/symptoms of physical distress      Intensity   THRR 40-80% of Max Heartrate 114-141    Ratings of Perceived Exertion 11-13    Perceived Dyspnea 0-4      Progression   Progression Continue to progress workloads to maintain intensity without signs/symptoms of physical distress.      Resistance Training   Training Prescription Yes    Weight 3 lb    Reps 10-15             Exercise Goals: Frequency: Be able to perform aerobic exercise two to three times per week in program working toward 2-5 days per week of home exercise.  Intensity: Work with a perceived exertion of 11 (fairly light) - 15 (hard) while following your exercise prescription.  We will make changes to your prescription with you as you progress through the program.   Duration: Be able to do 30 to 45 minutes of continuous aerobic exercise in addition to a 5 minute warm-up and a 5 minute cool-down routine.   Nutrition Goals: Your personal nutrition goals will be  established when you do your nutrition analysis with the dietician.  The following are general nutrition guidelines to follow: Cholesterol < 200mg /day Sodium < 1500mg /day Fiber: Women over 50 yrs - 21 grams per day  Personal Goals:  Personal Goals and Risk Factors at Admission - 07/07/21 1415       Core Components/Risk Factors/Patient Goals on Admission    Weight Management Yes;Weight Loss    Intervention Weight Management: Develop a combined nutrition and exercise program designed to reach desired caloric intake, while maintaining appropriate intake of nutrient and fiber, sodium and fats, and appropriate energy expenditure required for the weight goal.;Weight Management: Provide education and appropriate resources to help participant work on and attain dietary goals.;Weight Management/Obesity: Establish reasonable short term and long term weight goals.    Admit Weight 191 lb (86.6 kg)    Goal Weight: Short Term 186 lb (84.4 kg)    Goal Weight: Long Term 181 lb (82.1 kg)    Expected Outcomes Short Term: Continue to assess and modify interventions until short term weight is achieved;Long Term: Adherence to nutrition and physical activity/exercise program aimed toward attainment of established  weight goal;Weight Loss: Understanding of general recommendations for a balanced deficit meal plan, which promotes 1-2 lb weight loss per week and includes a negative energy balance of [phone] kcal/d;Understanding recommendations for meals to include 15-35% energy as protein, 25-35% energy from fat, 35-60% energy from carbohydrates, less than 200mg  of dietary cholesterol, 20-35 gm of total fiber daily;Understanding of distribution of calorie intake throughout the day with the consumption of 4-5 meals/snacks    Diabetes Yes    Intervention Provide education about signs/symptoms and action to take for hypo/hyperglycemia.;Provide education about proper nutrition, including hydration, and aerobic/resistive  exercise prescription along with prescribed medications to achieve blood glucose in normal ranges: Fasting glucose 65-99 mg/dL    Expected Outcomes Short Term: Participant verbalizes understanding of the signs/symptoms and immediate care of hyper/hypoglycemia, proper foot care and importance of medication, aerobic/resistive exercise and nutrition plan for blood glucose control.;Long Term: Attainment of HbA1C < 7%.    Hypertension Yes    Intervention Provide education on lifestyle modifcations including regular physical activity/exercise, weight management, moderate sodium restriction and increased consumption of fresh fruit, vegetables, and low fat dairy, alcohol moderation, and smoking cessation.;Monitor prescription use compliance.    Expected Outcomes Short Term: Continued assessment and intervention until BP is < 140/13mm HG in hypertensive participants. < 130/91mm HG in hypertensive participants with diabetes, heart failure or chronic kidney disease.;Long Term: Maintenance of blood pressure at goal levels.    Lipids Yes    Intervention Provide education and support for participant on nutrition & aerobic/resistive exercise along with prescribed medications to achieve LDL 70mg , HDL >40mg .    Expected Outcomes Short Term: Participant states understanding of desired cholesterol values and is compliant with medications prescribed. Participant is following exercise prescription and nutrition guidelines.;Long Term: Cholesterol controlled with medications as prescribed, with individualized exercise RX and with personalized nutrition plan. Value goals: LDL < 70mg , HDL > 40 mg.             Tobacco Use Initial Evaluation: Social History   Tobacco Use  Smoking Status Never  Smokeless Tobacco Never    Exercise Goals and Review:  Exercise Goals     Row Name 07/07/21 1414             Exercise Goals   Increase Physical Activity Yes       Intervention Provide advice, education, support and  counseling about physical activity/exercise needs.;Develop an individualized exercise prescription for aerobic and resistive training based on initial evaluation findings, risk stratification, comorbidities and participant's personal goals.       Expected Outcomes Short Term: Attend rehab on a regular basis to increase amount of physical activity.;Long Term: Add in home exercise to make exercise part of routine and to increase amount of physical activity.;Long Term: Exercising regularly at least 3-5 days a week.       Increase Strength and Stamina Yes       Intervention Provide advice, education, support and counseling about physical activity/exercise needs.;Develop an individualized exercise prescription for aerobic and resistive training based on initial evaluation findings, risk stratification, comorbidities and participant's personal goals.       Expected Outcomes Short Term: Increase workloads from initial exercise prescription for resistance, speed, and METs.;Short Term: Perform resistance training exercises routinely during rehab and add in resistance training at home;Long Term: Improve cardiorespiratory fitness, muscular endurance and strength as measured by increased METs and functional capacity ( )       Able to understand and use rate of perceived exertion (RPE) scale  Yes       Intervention Provide education and explanation on how to use RPE scale       Expected Outcomes Short Term: Able to use RPE daily in rehab to express subjective intensity level;Long Term:  Able to use RPE to guide intensity level when exercising independently       Able to understand and use Dyspnea scale Yes       Intervention Provide education and explanation on how to use Dyspnea scale       Expected Outcomes Short Term: Able to use Dyspnea scale daily in rehab to express subjective sense of shortness of breath during exertion;Long Term: Able to use Dyspnea scale to guide intensity level when exercising independently        Knowledge and understanding of Target Heart Rate Range (THRR) Yes       Intervention Provide education and explanation of THRR including how the numbers were predicted and where they are located for reference       Expected Outcomes Short Term: Able to state/look up THRR;Long Term: Able to use THRR to govern intensity when exercising independently;Short Term: Able to use daily as guideline for intensity in rehab       Able to check pulse independently Yes       Intervention Provide education and demonstration on how to check pulse in carotid and radial arteries.;Review the importance of being able to check your own pulse for safety during independent exercise       Expected Outcomes Short Term: Able to explain why pulse checking is important during independent exercise;Long Term: Able to check pulse independently and accurately       Understanding of Exercise Prescription Yes       Intervention Provide education, explanation, and written materials on patient's individual exercise prescription       Expected Outcomes Short Term: Able to explain program exercise prescription;Long Term: Able to explain home exercise prescription to exercise independently                Copy of goals given to participant.

## 2021-07-07 NOTE — Progress Notes (Signed)
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 Attainment of HbA1C < 7%.    Hypertension Yes    Intervention Provide education on lifestyle modifcations including regular physical activity/exercise, weight management, moderate sodium restriction and increased consumption of fresh fruit, vegetables, and low fat dairy, alcohol moderation, and smoking cessation.;Monitor prescription use compliance.    Expected Outcomes Short Term: Continued assessment and intervention until BP is < 140/3490mm HG in hypertensive participants. < 130/4980mm HG in hypertensive participants with diabetes, heart failure or chronic kidney disease.;Long Term: Maintenance of blood pressure at goal levels.    Lipids Yes    Intervention Provide education and support for  participant on nutrition & aerobic/resistive exercise along with prescribed medications to achieve LDL 70mg , HDL >40mg .    Expected Outcomes Short Term: Participant states understanding of desired cholesterol values and is compliant with medications prescribed. Participant is following exercise prescription and nutrition guidelines.;Long Term: Cholesterol controlled with medications as prescribed, with individualized exercise RX and with personalized nutrition plan. Value goals: LDL < 70mg , HDL > 40 mg.             Education:Diabetes - Individual verbal and written instruction to review signs/symptoms of diabetes, desired ranges of glucose level fasting, after meals and with exercise. Acknowledge that pre and post exercise glucose checks will be done for 3 sessions at entry of program. Flowsheet [facility]  Education need identified 07/07/21  Date 07/07/21  Educator KL  Instruction Review Code 1- Verbalizes Understanding       Core Components/Risk Factors/Patient Goals Review:    Core Components/Risk Factors/Patient Goals at Discharge (Final Review):    ITP Comments:  ITP Comments     Row Name 06/23/21 1006 07/07/21 0937         ITP Comments Virtual orientation call completed today. shehas an appointment on Date: 07/07/2021  for EP eval and gym Orientation.  Documentation of diagnosis can be found in CL  Date: 05/05/21 and 05/22/2021 . Completed 6MWT and gym orientation. Initial ITP created and sent for review to Dr. Bethann PunchesMark Miller, Medical Director.               Comments: Initial ITP

## 2021-07-13 ENCOUNTER — Other Ambulatory Visit: Payer: Self-pay

## 2021-07-13 DIAGNOSIS — Z955 Presence of coronary angioplasty implant and graft: Secondary | ICD-10-CM

## 2021-07-13 DIAGNOSIS — I214 Non-ST elevation (NSTEMI) myocardial infarction: Secondary | ICD-10-CM

## 2021-07-13 LAB — GLUCOSE, CAPILLARY
Glucose-Capillary: 87 mg/dL (ref 70–99)
Glucose-Capillary: 117 mg/dL — ABNORMAL HIGH (ref 70–99)

## 2021-07-13 NOTE — Progress Notes (Signed)
Daily Session Note  Patient Details  Name: Jackie Robinson MRN: [id number] Date of Birth: September 28, 1955 Referring Provider:   Flowsheet Row Cardiac Rehab from 07/07/2021 in [facility] Card[facility]  Referring Provider Donnelly Angelica MD       Encounter Date: 07/13/2021  Check In:  Session Check In - 07/13/21 1342       Check-In   Supervising physician immediately available to respond to emergencies See telemetry face sheet for immediately available ER MD    Location [facility]-Cardiac & Pulmonary Rehab    Staff Present Birdie Sons, MPA, RN;Joseph Lou Miner, MS, ASCM CEP, Exercise Physiologist    Virtual Visit No    Medication changes reported     No    Fall or balance concerns reported    No    Warm-up and Cool-down Performed on first and last piece of equipment    Resistance Training Performed Yes    VAD Patient? No    PAD/SET Patient? No      Pain Assessment   Currently in Pain? No/denies                Social History   Tobacco Use  Smoking Status Never  Smokeless Tobacco Never    Goals Met:  Independence with exercise equipment Exercise tolerated well No report of concerns or symptoms today Strength training completed today  Goals Unmet:  Not Applicable  Comments: First full day of exercise!  Patient was oriented to gym and equipment including functions, settings, policies, and procedures.  Patient's individual exercise prescription and treatment plan were reviewed.  All starting workloads were established based on the results of the 6 minute walk test done at initial orientation visit.  The plan for exercise progression was also introduced and progression will be customized based on patient's performance and goals.     Dr. Emily Filbert is Medical Director for [facility].  Dr. Ottie Glazier is Medical Director for [facility] Pulmonary [facility].

## 2021-07-15 ENCOUNTER — Encounter: Payer: Self-pay | Admitting: *Deleted

## 2021-07-15 DIAGNOSIS — Z955 Presence of coronary angioplasty implant and graft: Secondary | ICD-10-CM

## 2021-07-15 DIAGNOSIS — I214 Non-ST elevation (NSTEMI) myocardial infarction: Secondary | ICD-10-CM

## 2021-07-15 NOTE — Progress Notes (Signed)
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    Admit Weight 191 lb (86.6 kg)    Goal Weight: Short Term 186 lb (84.4 kg)    Goal Weight: Long Term 181 lb (82.1 kg)    Expected Outcomes Short Term: Continue to assess and modify interventions until short term weight is achieved;Long Term: Adherence to nutrition and physical activity/exercise program aimed toward attainment of established weight goal;Weight Loss: Understanding of general recommendations for a balanced deficit meal plan, which promotes 1-2 lb weight loss per week and includes a negative energy balance of [phone] kcal/d;Understanding recommendations for meals to include 15-35% energy as protein, 25-35% energy from fat, 35-60% energy from carbohydrates, less than 200mg  of dietary cholesterol, 20-35 gm of total fiber daily;Understanding of distribution of calorie intake throughout the day with the consumption of 4-5  meals/snacks    Diabetes Yes    Intervention Provide education about signs/symptoms and action to take for hypo/hyperglycemia.;Provide education about proper nutrition, including hydration, and aerobic/resistive exercise prescription along with prescribed medications to achieve blood glucose in normal ranges: Fasting glucose 65-99 mg/dL    Expected Outcomes Short Term: Participant verbalizes understanding of the signs/symptoms and immediate care of hyper/hypoglycemia, proper foot care and importance of medication, aerobic/resistive exercise and nutrition plan for blood glucose control.;Long Term: Attainment of HbA1C < 7%.    Hypertension Yes    Intervention Provide education on lifestyle modifcations including regular physical activity/exercise, weight management, moderate sodium restriction and increased consumption of fresh fruit, vegetables, and low fat dairy, alcohol moderation, and smoking cessation.;Monitor prescription use compliance.    Expected Outcomes Short Term: Continued assessment and intervention until BP is < 140/58mm HG in hypertensive participants. < 130/1mm HG in hypertensive participants with diabetes, heart failure or chronic kidney disease.;Long Term: Maintenance of blood pressure at goal levels.    Lipids Yes    Intervention Provide education and support for participant on nutrition & aerobic/resistive exercise along with prescribed medications to achieve LDL 70mg , HDL >40mg .    Expected Outcomes Short Term: Participant states understanding of desired cholesterol values and is compliant with medications prescribed. Participant is  following exercise prescription and nutrition guidelines.;Long Term: Cholesterol controlled with medications as prescribed, with individualized exercise RX and with personalized nutrition plan. Value goals: LDL < 70mg , HDL > 40 mg.             Education:Diabetes - Individual verbal and written instruction to review signs/symptoms of diabetes,  desired ranges of glucose level fasting, after meals and with exercise. Acknowledge that pre and post exercise glucose checks will be done for 3 sessions at entry of program. [place] from 07/07/2021 in [facility] Cardiac and Pulmonary Rehab  Education need identified 07/07/21  Date 07/07/21  Educator Glidden  Instruction Review Code 1- Verbalizes Understanding       Core Components/Risk Factors/Patient Goals Review:    Core Components/Risk Factors/Patient Goals at Discharge (Final Review):    ITP Comments:  ITP Comments     Row Name 06/23/21 1006 07/07/21 0937 07/13/21 1343 07/15/21 0842     ITP Comments Virtual orientation call completed today. shehas an appointment on Date: 07/07/2021  for EP eval and gym Orientation.  Documentation of diagnosis can be found in CL  Date: 05/05/21 and 05/22/2021 . Completed 6MWT and gym orientation. Initial ITP created and sent for review to Dr. Emily Filbert, Medical Director. First full day of exercise!  Patient was oriented to gym and equipment including functions, settings, policies, and procedures.  Patient's individual exercise prescription and treatment plan were reviewed.  All starting workloads were established based on the results of the 6 minute walk test done at initial orientation visit.  The plan for exercise progression was also introduced and progression will be customized based on patient's performance and goals. 30 Day review completed. Medical Director ITP review done, changes made as directed, and signed approval by Medical Director.   New to program             Comments:

## 2021-07-16 ENCOUNTER — Other Ambulatory Visit: Payer: Self-pay

## 2021-07-16 DIAGNOSIS — I214 Non-ST elevation (NSTEMI) myocardial infarction: Secondary | ICD-10-CM | POA: Diagnosis not present

## 2021-07-16 DIAGNOSIS — Z955 Presence of coronary angioplasty implant and graft: Secondary | ICD-10-CM

## 2021-07-16 LAB — GLUCOSE, CAPILLARY
Glucose-Capillary: 91 mg/dL (ref 70–99)
Glucose-Capillary: 92 mg/dL (ref 70–99)

## 2021-07-16 NOTE — Progress Notes (Signed)
Daily Session Note  Patient Details  Name: Jackie Robinson MRN: [id number] Date of Birth: 07-06-1955 Referring Provider:   Flowsheet Row Cardiac Rehab from 07/07/2021 in [facility] Cardiac and Pulmonary Rehab  Referring Provider Donnelly Angelica MD       Encounter Date: 07/16/2021  Check In:  Session Check In - 07/16/21 1344       Check-In   Supervising physician immediately available to respond to emergencies See telemetry face sheet for immediately available ER MD    Location [facility]-Cardiac & Pulmonary Rehab    Staff Present Hope Budds, RDN, LDN;Arly Salminen, RN, BSN;Joseph Jenera, RCP,RRT,BSRT    Virtual Visit No    Medication changes reported     No    Fall or balance concerns reported    No    Warm-up and Cool-down Performed on first and last piece of equipment    Resistance Training Performed Yes    VAD Patient? No    PAD/SET Patient? No      Pain Assessment   Currently in Pain? No/denies                Social History   Tobacco Use  Smoking Status Never  Smokeless Tobacco Never    Goals Met:  Independence with exercise equipment Exercise tolerated well No report of concerns or symptoms today Strength training completed today  Goals Unmet:  Not Applicable  Comments: Pt able to follow exercise prescription today without complaint.  Will continue to monitor for progression.   Dr. Emily Filbert is Medical Director for [place].  Dr. Ottie Glazier is Medical Director for [facility] Pulmonary Rehabilitation.

## 2021-07-17 ENCOUNTER — Other Ambulatory Visit: Payer: Self-pay

## 2021-07-17 ENCOUNTER — Emergency Department
Admission: EM | Admit: 2021-07-17 | Discharge: 2021-07-17 | Disposition: A | Discharge status: Home / Self Care | Payer: Medicare Other | Attending: Emergency Medicine | Admitting: Emergency Medicine

## 2021-07-17 ENCOUNTER — Emergency Department: Payer: Medicare Other

## 2021-07-17 DIAGNOSIS — Z20822 Contact with and (suspected) exposure to covid-19: Secondary | ICD-10-CM | POA: Diagnosis not present

## 2021-07-17 DIAGNOSIS — I10 Essential (primary) hypertension: Secondary | ICD-10-CM | POA: Insufficient documentation

## 2021-07-17 DIAGNOSIS — D739 Disease of spleen, unspecified: Secondary | ICD-10-CM | POA: Diagnosis not present

## 2021-07-17 DIAGNOSIS — E119 Type 2 diabetes mellitus without complications: Secondary | ICD-10-CM | POA: Insufficient documentation

## 2021-07-17 DIAGNOSIS — R0789 Other chest pain: Secondary | ICD-10-CM | POA: Insufficient documentation

## 2021-07-17 DIAGNOSIS — R079 Chest pain, unspecified: Secondary | ICD-10-CM

## 2021-07-17 LAB — CBC
HCT: 35.7 % — ABNORMAL LOW (ref 36.0–46.0)
Hemoglobin: 10.7 g/dL — ABNORMAL LOW (ref 12.0–15.0)
MCH: 27.1 pg (ref 26.0–34.0)
MCHC: 30 g/dL (ref 30.0–36.0)
MCV: 90.4 fL (ref 80.0–100.0)
Platelets: 417 10*3/uL — ABNORMAL HIGH (ref 150–400)
RBC: 3.95 MIL/uL (ref 3.87–5.11)
RDW: 14.6 % (ref 11.5–15.5)
WBC: 12.5 10*3/uL — ABNORMAL HIGH (ref 4.0–10.5)
nRBC: 0 % (ref 0.0–0.2)

## 2021-07-17 LAB — BASIC METABOLIC PANEL
Anion gap: 7 (ref 5–15)
BUN: 14 mg/dL (ref 8–23)
CO2: 27 mmol/L (ref 22–32)
Calcium: 8.7 mg/dL — ABNORMAL LOW (ref 8.9–10.3)
Chloride: 106 mmol/L (ref 98–111)
Creatinine, Ser: 0.75 mg/dL (ref 0.44–1.00)
GFR, Estimated: >60 mL/min (ref >60)
Glucose, Bld: 110 mg/dL — ABNORMAL HIGH (ref 70–99)
Potassium: 4.2 mmol/L (ref 3.5–5.1)
Sodium: 140 mmol/L (ref 135–145)

## 2021-07-17 LAB — TROPONIN I (HIGH SENSITIVITY)
Troponin I (High Sensitivity): <2 ng/L (ref <18)
Troponin I (High Sensitivity): <2 ng/L (ref <18)

## 2021-07-17 LAB — RESP PANEL BY RT-PCR (FLU A&B, COVID) ARPGX2
Influenza A by PCR: NEGATIVE
Influenza B by PCR: NEGATIVE
SARS Coronavirus 2 by RT PCR: NEGATIVE

## 2021-07-17 LAB — PROCALCITONIN: Procalcitonin: <0.1 ng/mL

## 2021-07-17 MED ORDER — SODIUM CHLORIDE 0.9 % IV BOLUS
500.0000 mL | Freq: Once | INTRAVENOUS | Status: AC
  Administered 2021-07-17: 500 mL via INTRAVENOUS

## 2021-07-17 MED ORDER — HYDROCORTISONE SOD SUC (PF) 250 MG IJ SOLR
200.0000 mg | Freq: Once | INTRAMUSCULAR | Status: AC
  Administered 2021-07-17: 200 mg via INTRAVENOUS
  Filled 2021-07-17: qty 200

## 2021-07-17 MED ORDER — DIPHENHYDRAMINE HCL 50 MG/ML IJ SOLN: 50.0000 mg | Freq: Once | INTRAMUSCULAR | Status: DC

## 2021-07-17 MED ORDER — IOHEXOL 350 MG/ML SOLN
100.0000 mL | Freq: Once | INTRAVENOUS | Status: AC | PRN
  Administered 2021-07-17: 100 mL via INTRAVENOUS

## 2021-07-17 MED ORDER — DIPHENHYDRAMINE HCL 25 MG PO CAPS: 50.0000 mg | ORAL_CAPSULE | Freq: Once | ORAL | Status: DC

## 2021-07-17 MED ORDER — NITROGLYCERIN 0.4 MG SL SUBL
0.4000 mg | SUBLINGUAL_TABLET | SUBLINGUAL | Status: DC | PRN
  Administered 2021-07-17 (×3): 0.4 mg via SUBLINGUAL
  Filled 2021-07-17: qty 1

## 2021-07-17 MED ORDER — PANTOPRAZOLE SODIUM 40 MG IV SOLR
40.0000 mg | Freq: Once | INTRAVENOUS | Status: AC
  Administered 2021-07-17: 40 mg via INTRAVENOUS
  Filled 2021-07-17: qty 40

## 2021-07-17 MED ORDER — DIPHENHYDRAMINE HCL 50 MG/ML IJ SOLN
50.0000 mg | Freq: Once | INTRAMUSCULAR | Status: AC
  Administered 2021-07-17: 50 mg via INTRAVENOUS
  Filled 2021-07-17: qty 1

## 2021-07-17 MED ORDER — ONDANSETRON HCL 4 MG/2ML IJ SOLN
4.0000 mg | Freq: Once | INTRAMUSCULAR | Status: AC
  Administered 2021-07-17: 4 mg via INTRAVENOUS
  Filled 2021-07-17: qty 2

## 2021-07-17 MED ORDER — MORPHINE SULFATE (PF) 4 MG/ML IV SOLN
4.0000 mg | Freq: Once | INTRAVENOUS | Status: AC
  Administered 2021-07-17: 4 mg via INTRAVENOUS
  Filled 2021-07-17: qty 1

## 2021-07-17 NOTE — ED Provider Notes (Signed)
[facility] Provider Note    Event Date/Time   First MD Initiated Contact with Patient 07/17/21 [phone]     (approximate)   History   Chest Pain   HPI  Jackie Robinson is a 66 y.o. female with a history of diabetes, hypertension, recent NSTEMI with PCI on May 22, 2021 who comes ED complaining of chest pain on the left chest radiating to left arm.  Rates it as severe.  Worse with leaning over.  Not exertional.  Somewhat pleuritic, but denies shortness of breath.  No vomiting.     Physical Exam   Triage Vital Signs: ED Triage Vitals  Enc Vitals Group     BP 07/17/21 0924 (!) 130/100     Pulse Rate 07/17/21 0924 92     Resp 07/17/21 0924 (!) 22     Temp 07/17/21 0924 98.5 F (36.9 C)     Temp Source 07/17/21 0924 Oral     SpO2 07/17/21 0924 95 %     Weight 07/17/21 0924 185 lb (83.9 kg)     Height 07/17/21 0924 5\' 6"  (1.676 m)     Head Circumference --      Peak Flow --      Pain Score 07/17/21 0924 9     Pain Loc --      Pain Edu? --      Excl. in GC? --     Most recent vital signs: Vitals:   07/17/21 1245 07/17/21 1500  BP: 125/77 130/71  Pulse: 85 98  Resp: 17 18  Temp: 98.8 F (37.1 C) 98.9 F (37.2 C)  SpO2: 98% 97%     General: Awake, no distress.  CV:  Good peripheral perfusion.  Symmetric pulses.  Regular rate and rhythm Resp:  Normal effort.  Unlabored, clear to auscultation bilaterally Abd:  No distention.  Soft and nontender Other:  No extremity swelling or inflammatory changes.  Full range of motion.   ED Results / Procedures / Treatments   Labs (all labs ordered are listed, but only abnormal results are displayed) Labs Reviewed  BASIC METABOLIC PANEL - Abnormal; Notable for the following components:      Result Value   Glucose, Bld 110 (*)    Calcium 8.7 (*)    All other components within normal limits  CBC - Abnormal; Notable for the following components:   WBC 12.5 (*)    Hemoglobin 10.7 (*)    HCT 35.7  (*)    Platelets 417 (*)    All other components within normal limits  TROPONIN I (HIGH SENSITIVITY)  TROPONIN I (HIGH SENSITIVITY)     EKG  Interpreted by me Normal sinus rhythm rate of 89, normal axis and intervals.  Poor R wave progression.  Normal ST segments and T waves, no acute ischemic changes   RADIOLOGY Chest x-ray viewed and interpreted by me, shows left lower lung infiltrate concerning for pneumonia.  Radiology report reviewed.    PROCEDURES:  Critical Care performed: No  Procedures   MEDICATIONS ORDERED IN ED: Medications  nitroGLYCERIN (NITROSTAT) SL tablet 0.4 mg (0.4 mg Sublingual Given 07/17/21 1059)  morphine 4 MG/ML injection 4 mg (4 mg Intravenous Given 07/17/21 1006)  ondansetron (ZOFRAN) injection 4 mg (4 mg Intravenous Given 07/17/21 1006)  sodium chloride 0.9 % bolus 500 mL (0 mLs Intravenous Stopped 07/17/21 1043)  hydrocortisone sodium succinate (SOLU-CORTEF) injection 200 mg (200 mg Intravenous Given 07/17/21 1128)  diphenhydrAMINE (BENADRYL) injection 50 mg (  50 mg Intravenous Given 07/17/21 1431)  pantoprazole (PROTONIX) injection 40 mg (40 mg Intravenous Given 07/17/21 1346)     IMPRESSION / MDM / ASSESSMENT AND PLAN / ED COURSE  I reviewed the triage vital signs and the nursing notes.                              Differential diagnosis includes, but is not limited to, aortic dissection, non-STEMI, GERD, pneumothorax, pneumonia, pleural effusion, pulmonary edema     Patient presents with severe chest pain since night in the setting of recent left heart catheterization and stent placement to LAD on 05/22/2021.  Reviewed electronic medical record cath report which also notes diffuse coronary artery atherosclerotic disease.  With recent instrumentation, she will require CT angiogram of the chest to evaluate for dissection.  We will cycle troponins, give morphine IV and nitroglycerin to attempt to resolve her pain.  Clinical Course as of 07/17/21  1525  Fri Jul 17, 2021  1315 Serial troponins are normal.  Will give Protonix while awaiting CT scan, anticipated to be performed at about 3:30 PM.  Blood pressure and other vitals remains normal. [PS]    Clinical Course User Index [PS] Sharman Cheek, MD     FINAL CLINICAL IMPRESSION(S) / ED DIAGNOSES   Final diagnoses:  Nonspecific chest pain     Rx / DC Orders   ED Discharge Orders     None        Note:  This document was prepared using Dragon voice recognition software and may include unintentional dictation errors.   Sharman Cheek, MD 07/17/21 1525

## 2021-07-17 NOTE — ED Provider Notes (Addendum)
Patient initially tells me her pain is a 1 or 2 out of 10.  Then her son tells me it is 5 out of 10 and finally she says it is actually a 3 out of 10.  I discussed the patient CT in detail with the radiologist.  Patient's radiologist feels there is only some atelectasis there and nothing else may be a small effusion on the left side.  Patient's procalcitonin is negative.  Patient's white count has been much higher and also been somewhat lower.  He could be elevated only from the pain.  I discussed with patient the fact that her troponins are also negative but because she still having pain and apparently still a fairly decent amount it would be safer if she stayed in the hospital.  Patient does not want to especially if she has to stay in the bed to the ER stretcher and I cannot guarantee that she will get a real bed and not have to stay in ER stretcher she wants to go home.  She understands that there is a risk involved.  She understands that I cannot tell where the pain was coming from.  Her troponins are negative her CT angio is essentially negative per radiology procalcitonin is negative.  She does want to go home.   Arnaldo Natal, MD 07/17/21 2000 I need to add that she is fully competent and aware of the risks involved.   Arnaldo Natal, MD 07/17/21 2001

## 2021-07-17 NOTE — ED Triage Notes (Signed)
Pt to ED for left sided chest pain radiating down left arm and neck. Reports increased pain with inspiration.  Appears uncomfortable.  Denies n/v Had stent placed on 12/2

## 2021-07-17 NOTE — Discharge Instructions (Addendum)
As we discussed I cannot find any cause for the pain that you are having.  The radiologist read the CT as only some atelectasis.  This is the partly collapsed lung that I was talking to you about.  Radiologist did not think there is any pneumonia.  Your white blood count is a little bit elevated but not significantly and the procalcitonin was negative for infection.  The procalcitonin is the test that is supposed to guide our antibiotic use.  Your troponins were also negative.  I think it would be safer if you spent the night in the hospital since you had that recent heart attack but since you want to go home I cannot stop you.  Please be sure to follow-up with your primary care doctor and your cardiologist in the next couple days.  Most importantly be sure to return if the symptoms get worse again.!  Even at 3:00 in the morning just call the ambulance and come back

## 2021-07-20 ENCOUNTER — Other Ambulatory Visit: Payer: Self-pay

## 2021-07-20 DIAGNOSIS — Z955 Presence of coronary angioplasty implant and graft: Secondary | ICD-10-CM

## 2021-07-20 DIAGNOSIS — I214 Non-ST elevation (NSTEMI) myocardial infarction: Secondary | ICD-10-CM | POA: Diagnosis not present

## 2021-07-20 LAB — GLUCOSE, CAPILLARY
Glucose-Capillary: 135 mg/dL — ABNORMAL HIGH (ref 70–99)
Glucose-Capillary: 134 mg/dL — ABNORMAL HIGH (ref 70–99)

## 2021-07-20 NOTE — Progress Notes (Signed)
Daily Session Note  Patient Details  Name: Jackie Robinson MRN: [id number] Date of Birth: April 01, 1956 Referring Provider:   Flowsheet Row Cardiac Rehab from 07/07/2021 in [facility] Cardiac and Pulmonary Rehab  Referring Provider Donnelly Angelica MD       Encounter Date: 07/20/2021  Check In:  Session Check In - 07/20/21 1332       Check-In   Supervising physician immediately available to respond to emergencies See telemetry face sheet for immediately available ER MD    Location [facility]-Cardiac & Pulmonary Rehab    Staff Present Birdie Sons, MPA, RN;Joseph Byers, Sharren [place], MS, ASCM CEP, Exercise Physiologist    Virtual Visit No    Medication changes reported     Yes    Comments added colchicine for 3 months and 12m prednisone for 3 days r/t pericarditis    Fall or balance concerns reported    No    Warm-up and Cool-down Performed on first and last piece of equipment    Resistance Training Performed Yes    VAD Patient? No    PAD/SET Patient? No      Pain Assessment   Currently in Pain? No/denies                Social History   Tobacco Use  Smoking Status Never  Smokeless Tobacco Never    Goals Met:  Independence with exercise equipment Exercise tolerated well No report of concerns or symptoms today Strength training completed today  Goals Unmet:  Not Applicable  Comments: Pt able to follow exercise prescription today without complaint.  Will continue to monitor for progression.    Dr. MEmily Filbertis Medical Director for [place]  Dr. FOttie Glazieris Medical Director for [facility].

## 2021-07-22 ENCOUNTER — Other Ambulatory Visit: Payer: Self-pay

## 2021-07-22 ENCOUNTER — Encounter: Payer: Medicare Other | Attending: Cardiology

## 2021-07-22 DIAGNOSIS — I214 Non-ST elevation (NSTEMI) myocardial infarction: Secondary | ICD-10-CM

## 2021-07-22 DIAGNOSIS — Z5189 Encounter for other specified aftercare: Secondary | ICD-10-CM | POA: Insufficient documentation

## 2021-07-22 DIAGNOSIS — I252 Old myocardial infarction: Secondary | ICD-10-CM | POA: Diagnosis not present

## 2021-07-22 DIAGNOSIS — Z955 Presence of coronary angioplasty implant and graft: Secondary | ICD-10-CM

## 2021-07-22 NOTE — Progress Notes (Signed)
Daily Session Note  Patient Details  Name: Jackie Robinson MRN: [id number] Date of Birth: 1955/11/18 Referring Provider:   Flowsheet Row Cardiac Rehab from 07/07/2021 in [facility] Cardiac and Pulmonary Rehab  Referring Provider Donnelly Angelica MD       Encounter Date: 07/22/2021  Check In:  Session Check In - 07/22/21 1334       Check-In   Supervising physician immediately available to respond to emergencies See telemetry face sheet for immediately available ER MD    Location [facility]-Cardiac & Pulmonary Rehab    Staff Present Birdie Sons, MPA, RN;Joseph Lou Miner, MS, ASCM CEP, Exercise Physiologist    Virtual Visit No    Medication changes reported     No    Fall or balance concerns reported    No    Warm-up and Cool-down Performed on first and last piece of equipment    Resistance Training Performed Yes    VAD Patient? No    PAD/SET Patient? No      Pain Assessment   Currently in Pain? No/denies                Social History   Tobacco Use  Smoking Status Never  Smokeless Tobacco Never    Goals Met:  Independence with exercise equipment Exercise tolerated well No report of concerns or symptoms today Strength training completed today  Goals Unmet:  Not Applicable  Comments: Pt able to follow exercise prescription today without complaint.  Will continue to monitor for progression.    Dr. Emily Filbert is Medical Director for [place].  Dr. Ottie Glazier is Medical Director for [facility] Pulmonary Rehabilitation.

## 2021-07-23 ENCOUNTER — Encounter: Payer: Medicare Other | Admitting: *Deleted

## 2021-07-23 ENCOUNTER — Other Ambulatory Visit: Payer: Self-pay

## 2021-07-23 DIAGNOSIS — Z5189 Encounter for other specified aftercare: Secondary | ICD-10-CM | POA: Diagnosis not present

## 2021-07-23 DIAGNOSIS — Z955 Presence of coronary angioplasty implant and graft: Secondary | ICD-10-CM

## 2021-07-23 DIAGNOSIS — I214 Non-ST elevation (NSTEMI) myocardial infarction: Secondary | ICD-10-CM

## 2021-07-23 NOTE — Progress Notes (Signed)
Daily Session Note  Patient Details  Name: Jackie Robinson MRN: [id number] Date of Birth: 11-16-1955 Referring Provider:   Flowsheet Row Cardiac Rehab from 07/07/2021 in [facility] Cardiac and Pulmonary Rehab  Referring Provider Donnelly Angelica MD       Encounter Date: 07/23/2021  Check In:  Session Check In - 07/23/21 1328       Check-In   Supervising physician immediately available to respond to emergencies See telemetry face sheet for immediately available ER MD    Location [facility]-Cardiac & Pulmonary Rehab    Staff Present Renita Papa, RN BSN;Joseph [place], RCP,RRT,BSRT;Jessica [place], [place], RCEP, CCRP, CCET    Virtual Visit No    Medication changes reported     No    Fall or balance concerns reported    No    Warm-up and Cool-down Performed on first and last piece of equipment    Resistance Training Performed Yes    VAD Patient? No    PAD/SET Patient? No      Pain Assessment   Currently in Pain? No/denies                Social History   Tobacco Use  Smoking Status Never  Smokeless Tobacco Never    Goals Met:  Independence with exercise equipment Exercise tolerated well No report of concerns or symptoms today Strength training completed today  Goals Unmet:  Not Applicable  Comments: Pt able to follow exercise prescription today without complaint.  Will continue to monitor for progression.    Dr. Emily Filbert is Medical Director for [place].  Dr. Ottie Glazier is Medical Director for [facility] Pulmonary Rehabilitation.

## 2021-07-27 ENCOUNTER — Other Ambulatory Visit: Payer: Self-pay

## 2021-07-27 DIAGNOSIS — Z5189 Encounter for other specified aftercare: Secondary | ICD-10-CM | POA: Diagnosis not present

## 2021-07-27 DIAGNOSIS — I214 Non-ST elevation (NSTEMI) myocardial infarction: Secondary | ICD-10-CM

## 2021-07-27 DIAGNOSIS — Z955 Presence of coronary angioplasty implant and graft: Secondary | ICD-10-CM

## 2021-07-27 NOTE — Progress Notes (Signed)
Completed initial RD consultation ?

## 2021-07-27 NOTE — Progress Notes (Signed)
Daily Session Note  Patient Details  Name: Jackie Robinson MRN: [id number] Date of Birth: 1956-04-27 Referring Provider:   Flowsheet Row Cardiac Rehab from 07/07/2021 in [facility] Cardiac and Pulmonary Rehab  Referring Provider Donnelly Angelica MD       Encounter Date: 07/27/2021  Check In:  Session Check In - 07/27/21 1326       Check-In   Supervising physician immediately available to respond to emergencies See telemetry face sheet for immediately available ER MD    Location [facility]-Cardiac & Pulmonary Rehab    Staff Present Birdie Sons, MPA, Nino Glow, MS, ASCM CEP, Exercise Physiologist;Joseph Tessie [place], [place]    Virtual Visit No    Medication changes reported     No    Fall or balance concerns reported    No    Warm-up and Cool-down Performed on first and last piece of equipment    Resistance Training Performed Yes    VAD Patient? No    PAD/SET Patient? No      Pain Assessment   Currently in Pain? No/denies                Social History   Tobacco Use  Smoking Status Never  Smokeless Tobacco Never    Goals Met:  Independence with exercise equipment Exercise tolerated well No report of concerns or symptoms today Strength training completed today  Goals Unmet:  Not Applicable  Comments: Pt able to follow exercise prescription today without complaint.  Will continue to monitor for progression.    Dr. Emily Filbert is Medical Director for [place].  Dr. Ottie Glazier is Medical Director for [facility] Pulmonary Rehabilitation.

## 2021-07-29 ENCOUNTER — Other Ambulatory Visit: Payer: Self-pay

## 2021-07-29 DIAGNOSIS — I214 Non-ST elevation (NSTEMI) myocardial infarction: Secondary | ICD-10-CM

## 2021-07-29 DIAGNOSIS — Z5189 Encounter for other specified aftercare: Secondary | ICD-10-CM | POA: Diagnosis not present

## 2021-07-29 DIAGNOSIS — Z955 Presence of coronary angioplasty implant and graft: Secondary | ICD-10-CM

## 2021-07-29 NOTE — Progress Notes (Signed)
Daily Session Note  Patient Details  Name: Jackie Robinson MRN: [id number] Date of Birth: 10-04-1955 Referring Provider:   Flowsheet Row Cardiac Rehab from 07/07/2021 in [facility] Cardiac and Pulmonary Rehab  Referring Provider Donnelly Angelica MD       Encounter Date: 07/29/2021  Check In:  Session Check In - 07/29/21 1335       Check-In   Supervising physician immediately available to respond to emergencies See telemetry face sheet for immediately available ER MD    Location [facility]-Cardiac & Pulmonary Rehab    Staff Present Birdie Sons, MPA, RN;Melissa [place]la, RDN, Tawanna Solo, MS, ASCM CEP, Exercise Physiologist    Virtual Visit No    Medication changes reported     No    Fall or balance concerns reported    No    Warm-up and Cool-down Performed on first and last piece of equipment    Resistance Training Performed Yes    VAD Patient? No    PAD/SET Patient? No      Pain Assessment   Currently in Pain? No/denies                Social History   Tobacco Use  Smoking Status Never  Smokeless Tobacco Never    Goals Met:  Independence with exercise equipment Exercise tolerated well No report of concerns or symptoms today Strength training completed today  Goals Unmet:  Not Applicable  Comments: Pt able to follow exercise prescription today without complaint.  Will continue to monitor for progression.    Dr. Emily Filbert is Medical Director for [place].  Dr. Ottie Glazier is Medical Director for [facility] Pulmonary Rehabilitation.

## 2021-07-30 ENCOUNTER — Encounter: Payer: Medicare Other | Admitting: *Deleted

## 2021-07-30 ENCOUNTER — Other Ambulatory Visit: Payer: Self-pay

## 2021-07-30 DIAGNOSIS — Z5189 Encounter for other specified aftercare: Secondary | ICD-10-CM | POA: Diagnosis not present

## 2021-07-30 DIAGNOSIS — I214 Non-ST elevation (NSTEMI) myocardial infarction: Secondary | ICD-10-CM

## 2021-07-30 DIAGNOSIS — Z955 Presence of coronary angioplasty implant and graft: Secondary | ICD-10-CM

## 2021-07-30 NOTE — Progress Notes (Signed)
Daily Session Note  Patient Details  Name: KOA PALLA MRN: [id number] Date of Birth: Nov 12, 1955 Referring Provider:   Flowsheet Row Cardiac Rehab from 07/07/2021 in [facility] Cardiac and Pulmonary Rehab  Referring Provider Donnelly Angelica MD       Encounter Date: 07/30/2021  Check In:  Session Check In - 07/30/21 1324       Check-In   Supervising physician immediately available to respond to emergencies See telemetry face sheet for immediately available ER MD    Location [facility]-Cardiac & Pulmonary Rehab    Staff Present Renita Papa, RN BSN;Joseph Lowry, RCP,RRT,BSRT;Jessica [place], [place], RCEP, CCRP, CCET    Virtual Visit No    Medication changes reported     No    Fall or balance concerns reported    No    Warm-up and Cool-down Performed on first and last piece of equipment    Resistance Training Performed Yes    VAD Patient? No    PAD/SET Patient? No      Pain Assessment   Currently in Pain? No/denies                Social History   Tobacco Use  Smoking Status Never  Smokeless Tobacco Never    Goals Met:  Independence with exercise equipment Exercise tolerated well No report of concerns or symptoms today Strength training completed today  Goals Unmet:  Not Applicable  Comments: Pt able to follow exercise prescription today without complaint.  Will continue to monitor for progression.  Reviewed home exercise with pt today.  Pt plans to walk at home and use staff videos at home  for exercise.  Reviewed THR, pulse, RPE, sign and symptoms, pulse oximetery and when to call 911 or MD.  Also discussed weather considerations and indoor options.  Pt voiced understanding.   Dr. Emily Filbert is Medical Director for [place].  Dr. Ottie Glazier is Medical Director for [facility] Pulmonary Rehabilitation.

## 2021-08-03 ENCOUNTER — Telehealth: Payer: Self-pay | Admitting: *Deleted

## 2021-08-03 NOTE — Telephone Encounter (Signed)
Jackie Robinson called staff to state she was in pain from her pericarditis and is waiting for the doctor to call her back about pain medicine. She did not feel like coming today and plans to reevaluate on Wednesday to see if she feels better.

## 2021-08-05 ENCOUNTER — Telehealth: Payer: Self-pay

## 2021-08-05 DIAGNOSIS — I214 Non-ST elevation (NSTEMI) myocardial infarction: Secondary | ICD-10-CM

## 2021-08-05 DIAGNOSIS — Z955 Presence of coronary angioplasty implant and graft: Secondary | ICD-10-CM

## 2021-08-05 NOTE — Telephone Encounter (Signed)
Patient still feeling pain from pericarditis and called out from rehab. Has an echo and doctor follow up appointment next week and will have her wait to return to rehab until she receives results back from doctor. Follow up appt on 2/22- patient will follow up with [place] after that depending on doctor's appointment and how she feels.

## 2021-08-10 ENCOUNTER — Other Ambulatory Visit: Payer: Self-pay | Admitting: Internal Medicine

## 2021-08-10 DIAGNOSIS — Z1231 Encounter for screening mammogram for malignant neoplasm of breast: Secondary | ICD-10-CM

## 2021-08-12 ENCOUNTER — Encounter: Payer: Self-pay | Admitting: *Deleted

## 2021-08-12 DIAGNOSIS — I214 Non-ST elevation (NSTEMI) myocardial infarction: Secondary | ICD-10-CM

## 2021-08-12 DIAGNOSIS — Z955 Presence of coronary angioplasty implant and graft: Secondary | ICD-10-CM

## 2021-08-12 NOTE — Progress Notes (Signed)
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 Short:Talk with doctor today Long: Conitnue to work on sleep       Interventions Stress management education;Encouraged to attend Cardiac Rehabilitation for the exercise       Continue Psychosocial Services  Follow up required by staff                Psychosocial Discharge (Final Psychosocial Re-Evaluation):  Psychosocial Re-Evaluation - 07/30/21 1408       Psychosocial Re-Evaluation   Current issues with Current Stress Concerns;Current Sleep Concerns    Comments Karys is doing well in rehab.  She has been getting frustrated with her chest pain that has picked up again over the last two weeks.  She has an appointment to see the cardiologist today.  Her fluid has been building up again and they have extended one of her medications.   She is going to take a copy of her reports with her today to her appointment.  She has not been sleeping well with all of this going on and was encouraged  to talk to her doctor about this as well.    Expected Outcomes Short:Talk with doctor today Long: Conitnue to work on sleep    Interventions Stress management education;Encouraged to attend Cardiac Rehabilitation for the exercise    Continue Psychosocial Services  Follow up required by staff  Vocational Rehabilitation: Provide vocational rehab assistance to qualifying candidates.   Vocational Rehab Evaluation & Intervention:  Vocational Rehab - 06/23/21 0953       Initial Vocational Rehab Evaluation & Intervention   Assessment shows need for Vocational Rehabilitation No             Education: Education Goals: Education classes will be provided on a variety of topics geared toward better understanding of heart health and risk factor modification. Participant will state understanding/return demonstration of topics presented as noted by education test scores.  Learning Barriers/Preferences:  Learning Barriers/Preferences - 06/23/21 5625       Learning Barriers/Preferences   Learning Barriers None    Learning Preferences None             General Cardiac Education Topics:  AED/CPR: - Group verbal and written instruction with the use of models to demonstrate the basic use of the AED with the basic ABC's of resuscitation.   Anatomy and Cardiac Procedures: - Group verbal and visual presentation and models provide information about basic cardiac anatomy and function. Reviews the testing methods done to diagnose heart disease and the outcomes of the test results. Describes the treatment choices: Medical Management, Angioplasty, or Coronary Bypass Surgery for treating various heart conditions including Myocardial Infarction, Angina, Valve Disease, and Cardiac Arrhythmias.  Written material given at graduation. Flowsheet Row Cardiac Rehab from 07/29/2021 in [facility] Cardiac and Pulmonary Rehab  Education need identified 07/07/21       Medication Safety: - Group  verbal and visual instruction to review commonly prescribed medications for heart and lung disease. Reviews the medication, class of the drug, and side effects. Includes the steps to properly store meds and maintain the prescription regimen.  Written material given at graduation.   Intimacy: - Group verbal instruction through game format to discuss how heart and lung disease can affect sexual intimacy. Written material given at graduation..   Know Your Numbers and Heart Failure: - Group verbal and visual instruction to discuss disease risk factors for cardiac and pulmonary disease and treatment options.  Reviews associated critical values for Overweight/Obesity, Hypertension, Cholesterol, and Diabetes.  Discusses basics of heart failure: signs/symptoms and treatments.  Introduces Heart Failure Zone chart for action plan for heart failure.  Written material given at graduation. Flowsheet Row Cardiac Rehab from 07/29/2021 in [facility] Cardiac and Pulmonary Rehab  Education need identified 07/07/21       Infection Prevention: - Provides verbal and written material to individual with discussion of infection control including proper hand washing and proper equipment cleaning during exercise session. Flowsheet Row Cardiac Rehab from 07/29/2021 in [facility] Cardiac and Pulmonary Rehab  Education need identified 07/07/21  Date 07/07/21  Educator [place]  Instruction Review Code 1- Verbalizes Understanding       Falls Prevention: - Provides verbal and written material to individual with discussion of falls prevention and safety. Flowsheet Row Cardiac Rehab from 07/29/2021 in [facility] Cardiac and Pulmonary Rehab  Education need identified 07/07/21  Date 07/07/21  Educator [place]  Instruction Review Code 1- Verbalizes Understanding       Other: -Provides group and verbal instruction on various topics (see comments)   Knowledge Questionnaire Score:  Knowledge Questionnaire Score - 07/07/21 6389       Knowledge  Questionnaire Score   Pre Score 23/26: MI, BP, Exercise             Core Components/Risk Factors/Patient Goals at Admission:  Personal Goals and Risk Factors at Admission -  07/07/21 1415       Core Components/Risk Factors/Patient Goals on Admission    Weight Management Yes;Weight Loss    Intervention Weight Management: Develop a combined nutrition and exercise program designed to reach desired caloric intake, while maintaining appropriate intake of nutrient and fiber, sodium and fats, and appropriate energy expenditure required for the weight goal.;Weight Management: Provide education and appropriate resources to help participant work on and attain dietary goals.;Weight Management/Obesity: Establish reasonable short term and long term weight goals.    Admit Weight 191 lb (86.6 kg)    Goal Weight: Short Term 186 lb (84.4 kg)    Goal Weight: Long Term 181 lb (82.1 kg)    Expected Outcomes Short Term: Continue to assess and modify interventions until short term weight is achieved;Long Term: Adherence to nutrition and physical activity/exercise program aimed toward attainment of established weight goal;Weight Loss: Understanding of general recommendations for a balanced deficit meal plan, which promotes 1-2 lb weight loss per week and includes a negative energy balance of [phone] kcal/d;Understanding recommendations for meals to include 15-35% energy as protein, 25-35% energy from fat, 35-60% energy from carbohydrates, less than 269m of dietary cholesterol, 20-35 gm of total fiber daily;Understanding of distribution of calorie intake throughout the day with the consumption of 4-5 meals/snacks    Diabetes Yes    Intervention Provide education about signs/symptoms and action to take for hypo/hyperglycemia.;Provide education about proper nutrition, including hydration, and aerobic/resistive exercise prescription along with prescribed medications to achieve blood glucose in normal ranges: Fasting  glucose 65-99 mg/dL    Expected Outcomes Short Term: Participant verbalizes understanding of the signs/symptoms and immediate care of hyper/hypoglycemia, proper foot care and importance of medication, aerobic/resistive exercise and nutrition plan for blood glucose control.;Long Term: Attainment of HbA1C < 7%.    Hypertension Yes    Intervention Provide education on lifestyle modifcations including regular physical activity/exercise, weight management, moderate sodium restriction and increased consumption of fresh fruit, vegetables, and low fat dairy, alcohol moderation, and smoking cessation.;Monitor prescription use compliance.    Expected Outcomes Short Term: Continued assessment and intervention until BP is < 140/952mHG in hypertensive participants. < 130/808mG in hypertensive participants with diabetes, heart failure or chronic kidney disease.;Long Term: Maintenance of blood pressure at goal levels.    Lipids Yes    Intervention Provide education and support for participant on nutrition & aerobic/resistive exercise along with prescribed medications to achieve LDL <69m25mDL >40mg37m Expected Outcomes Short Term: Participant states understanding of desired cholesterol values and is compliant with medications prescribed. Participant is following exercise prescription and nutrition guidelines.;Long Term: Cholesterol controlled with medications as prescribed, with individualized exercise RX and with personalized nutrition plan. Value goals: LDL < 69mg,21m > 40 mg.             Education:Diabetes - Individual verbal and written instruction to review signs/symptoms of diabetes, desired ranges of glucose level fasting, after meals and with exercise. Acknowledge that pre and post exercise glucose checks will be done for 3 sessions at entry of program. [facility]  Education need identified 07/07/21  Date 07/07/21  Educator [place]   [place] Code 1- Verbalizes Understanding       Core Components/Risk Factors/Patient Goals Review:   Goals and Risk Factor Review     Row Name 07/30/21 1411             Core Components/Risk Factors/Patient Goals Review  Personal Goals Review Weight Management/Obesity;Hypertension;Diabetes       Review Kameela is doing well in rehab.  Her weight is holding steady.  Her pressures are doing well in class, she is not checking them at home but plans to start.  Her sugars are doing well.  She see her cardiologist today and plans to talk with him about her chest pain.       Expected Outcomes Short: See cardilogist Long: Continue to montior risk factors                Core Components/Risk Factors/Patient Goals at Discharge (Final Review):   Goals and Risk Factor Review - 07/30/21 1411       Core Components/Risk Factors/Patient Goals Review   Personal Goals Review Weight Management/Obesity;Hypertension;Diabetes    Review Jayona is doing well in rehab.  Her weight is holding steady.  Her pressures are doing well in class, she is not checking them at home but plans to start.  Her sugars are doing well.  She see her cardiologist today and plans to talk with him about her chest pain.    Expected Outcomes Short: See cardilogist Long: Continue to montior risk factors             ITP Comments:  ITP Comments     Row Name 06/23/21 1006 07/07/21 0937 07/13/21 1343 07/15/21 0842 07/27/21 1534   ITP Comments Virtual orientation call completed today. shehas an appointment on Date: 07/07/2021  for EP eval and gym Orientation.  Documentation of diagnosis can be found in CL  Date: 05/05/21 and 05/22/2021 . Completed 6MWT and gym orientation. Initial ITP created and sent for review to Dr. Emily Filbert, Medical Director. First full day of exercise!  Patient was oriented to gym and equipment including functions, settings, policies, and procedures.  Patient's individual exercise prescription  and treatment plan were reviewed.  All starting workloads were established based on the results of the 6 minute walk test done at initial orientation visit.  The plan for exercise progression was also introduced and progression will be customized based on patient's performance and goals. 30 Day review completed. Medical Director ITP review done, changes made as directed, and signed approval by Medical Director.   New to program Completed initial RD consultation    Row Name 08/05/21 1255 08/12/21 0823         ITP Comments Patient still feeling pain from pericarditis and called out from rehab. Has an echo and doctor follow up appointment on  2/22- will follow up after that. 30 Day review completed. Medical Director ITP review done, changes made as directed, and signed approval by Medical Director. 3 visits this month               Comments:

## 2021-08-13 ENCOUNTER — Telehealth: Payer: Self-pay | Admitting: *Deleted

## 2021-08-13 ENCOUNTER — Encounter: Payer: Self-pay | Admitting: *Deleted

## 2021-08-13 NOTE — Telephone Encounter (Signed)
Jackie Robinson called to inform staff that her cardiologist wants her to stay out of cardiac rehab until after her follow up on 3/9. She is still recovering from pericarditis.

## 2021-08-31 ENCOUNTER — Encounter: Payer: Medicare Other | Attending: Cardiology

## 2021-08-31 ENCOUNTER — Other Ambulatory Visit: Payer: Self-pay

## 2021-08-31 DIAGNOSIS — I214 Non-ST elevation (NSTEMI) myocardial infarction: Secondary | ICD-10-CM | POA: Insufficient documentation

## 2021-08-31 DIAGNOSIS — Z955 Presence of coronary angioplasty implant and graft: Secondary | ICD-10-CM | POA: Diagnosis present

## 2021-08-31 NOTE — Progress Notes (Signed)
Daily Session Note ? ?Patient Details  ?Name: Jackie Robinson ?MRN: [id number] ?Date of Birth: 1956/05/19 ?Referring Provider:   ?Flowsheet Row Cardiac Rehab from 07/07/2021 in [facility] Cardiac and Pulmonary Rehab  ?Referring Provider Donnelly Angelica MD  ? ?  ? ? ?Encounter Date: 08/31/2021 ? ?Check In: ? Session Check In - 08/31/21 1407   ? ?  ? Check-In  ? Supervising physician immediately available to respond to emergencies See telemetry face sheet for immediately available ER MD   ? Location [facility]-Cardiac & Pulmonary Rehab   ? Staff Present Birdie Sons, MPA, RN;Joseph [place], RCP,RRT,BSRT;Kara [place], MS, ASCM CEP, Exercise Physiologist   ? Virtual Visit No   ? Medication changes reported     No   ? Fall or balance concerns reported    No   ? Warm-up and Cool-down Performed on first and last piece of equipment   ? Resistance Training Performed Yes   ? VAD Patient? No   ? PAD/SET Patient? No   ?  ? Pain Assessment  ? Currently in Pain? No/denies   ? ?  ?  ? ?  ? ? ? ? ? ?Social History  ? ?Tobacco Use  ?Smoking Status Never  ?Smokeless Tobacco Never  ? ? ?Goals Met:  ?Independence with exercise equipment ?Exercise tolerated well ?No report of concerns or symptoms today ?Strength training completed today ? ?Goals Unmet:  ?Not Applicable ? ?Comments: Pt able to follow exercise prescription today without complaint.  Will continue to monitor for progression. ? ? ? ?Dr. Emily Filbert is Medical Director for [facility].  ?Dr. Ottie Glazier is Medical Director for [facility] Pulmonary Rehabilitation. ?

## 2021-09-02 ENCOUNTER — Other Ambulatory Visit: Payer: Self-pay

## 2021-09-02 DIAGNOSIS — Z955 Presence of coronary angioplasty implant and graft: Secondary | ICD-10-CM | POA: Diagnosis not present

## 2021-09-02 DIAGNOSIS — I214 Non-ST elevation (NSTEMI) myocardial infarction: Secondary | ICD-10-CM

## 2021-09-02 NOTE — Progress Notes (Signed)
Daily Session Note ? ?Patient Details  ?Name: Jackie Robinson ?MRN: [id number] ?Date of Birth: 12/12/1955 ?Referring Provider:   ?Flowsheet Row Cardiac Rehab from 07/07/2021 in [facility] Cardiac and Pulmonary Rehab  ?Referring Provider Donnelly Angelica MD  ? ?  ? ? ?Encounter Date: 09/02/2021 ? ?Check In: ? Session Check In - 09/02/21 1357   ? ?  ? Check-In  ? Supervising physician immediately available to respond to emergencies See telemetry face sheet for immediately available ER MD   ? Location [facility]-Cardiac & Pulmonary Rehab   ? Staff Present Birdie Sons, MPA, RN;Joseph [place], RCP,RRT,BSRT;Laureen Owens Shark, BS, RRT, CPFT;Jessica [place], MA, RCEP, CCRP, CCET;Melissa [place], RDN, LDN   ? Virtual Visit No   ? Medication changes reported     No   ? Fall or balance concerns reported    No   ? Warm-up and Cool-down Performed on first and last piece of equipment   ? Resistance Training Performed Yes   ? VAD Patient? No   ? PAD/SET Patient? No   ?  ? Pain Assessment  ? Currently in Pain? No/denies   ? ?  ?  ? ?  ? ? ? ? ? ?Social History  ? ?Tobacco Use  ?Smoking Status Never  ?Smokeless Tobacco Never  ? ? ?Goals Met:  ?Independence with exercise equipment ?Exercise tolerated well ?No report of concerns or symptoms today ?Strength training completed today ? ?Goals Unmet:  ?Not Applicable ? ?Comments: Pt able to follow exercise prescription today without complaint.  Will continue to monitor for progression. ? ? ? ?Dr. Emily Filbert is Medical Director for [facility].  ?Dr. Ottie Glazier is Medical Director for [facility] Pulmonary Rehabilitation. ?

## 2021-09-03 ENCOUNTER — Encounter: Payer: Medicare Other | Admitting: *Deleted

## 2021-09-03 ENCOUNTER — Other Ambulatory Visit: Payer: Self-pay

## 2021-09-03 DIAGNOSIS — Z955 Presence of coronary angioplasty implant and graft: Secondary | ICD-10-CM

## 2021-09-03 DIAGNOSIS — I214 Non-ST elevation (NSTEMI) myocardial infarction: Secondary | ICD-10-CM

## 2021-09-03 NOTE — Progress Notes (Signed)
Daily Session Note ? ?Patient Details  ?Name: Jackie Robinson ?MRN: [id number] ?Date of Birth: January 23, 1956 ?Referring Provider:   ?Flowsheet Row Cardiac Rehab from 07/07/2021 in [facility] Cardiac and Pulmonary Rehab  ?Referring Provider Donnelly Angelica MD  ? ?  ? ? ?Encounter Date: 09/03/2021 ? ?Check In: ? Session Check In - 09/03/21 1346   ? ?  ? Check-In  ? Supervising physician immediately available to respond to emergencies See telemetry face sheet for immediately available ER MD   ? Location [facility]-Cardiac & Pulmonary Rehab   ? Staff Present Renita Papa, RN BSN;Joseph [place], RCP,RRT,BSRT;Jessica [place], [place], [place], [place], CCET   ? Virtual Visit No   ? Medication changes reported     No   ? Fall or balance concerns reported    No   ? Warm-up and Cool-down Performed on first and last piece of equipment   ? Resistance Training Performed Yes   ? VAD Patient? No   ? PAD/SET Patient? No   ?  ? Pain Assessment  ? Currently in Pain? No/denies   ? ?  ?  ? ?  ? ? ? ? ? ?Social History  ? ?Tobacco Use  ?Smoking Status Never  ?Smokeless Tobacco Never  ? ? ?Goals Met:  ?Independence with exercise equipment ?Exercise tolerated well ?No report of concerns or symptoms today ?Strength training completed today ? ?Goals Unmet:  ?Not Applicable ? ?Comments: Pt able to follow exercise prescription today without complaint.  Will continue to monitor for progression. ? ? ? ?Dr. Emily Filbert is Medical Director for [place].  ?Dr. Ottie Glazier is Medical Director for [facility] Pulmonary Rehabilitation. ?

## 2021-09-07 ENCOUNTER — Other Ambulatory Visit: Payer: Self-pay

## 2021-09-07 DIAGNOSIS — I214 Non-ST elevation (NSTEMI) myocardial infarction: Secondary | ICD-10-CM

## 2021-09-07 DIAGNOSIS — Z955 Presence of coronary angioplasty implant and graft: Secondary | ICD-10-CM

## 2021-09-07 NOTE — Progress Notes (Signed)
Daily Session Note ? ?Patient Details  ?Name: Jackie Robinson ?MRN: [id number] ?Date of Birth: 12/02/1955 ?Referring Provider:   ?Flowsheet Row Cardiac Rehab from 07/07/2021 in [facility] Cardiac and Pulmonary Rehab  ?Referring Provider Donnelly Angelica MD  ? ?  ? ? ?Encounter Date: 09/07/2021 ? ?Check In: ? Session Check In - 09/07/21 1401   ? ?  ? Check-In  ? Supervising physician immediately available to respond to emergencies See telemetry face sheet for immediately available ER MD   ? Location [facility]-Cardiac & Pulmonary Rehab   ? Staff Present Birdie Sons, MPA, RN;Joseph [place], RCP,RRT,BSRT;Kara [place], MS, ASCM CEP, Exercise Physiologist   ? Virtual Visit No   ? Medication changes reported     No   ? Fall or balance concerns reported    No   ? Warm-up and Cool-down Performed on first and last piece of equipment   ? Resistance Training Performed Yes   ? VAD Patient? No   ? PAD/SET Patient? No   ?  ? Pain Assessment  ? Currently in Pain? No/denies   ? ?  ?  ? ?  ? ? ? ? ? ?Social History  ? ?Tobacco Use  ?Smoking Status Never  ?Smokeless Tobacco Never  ? ? ?Goals Met:  ?Independence with exercise equipment ?Exercise tolerated well ?No report of concerns or symptoms today ?Strength training completed today ? ?Goals Unmet:  ?Not Applicable ? ?Comments: Pt able to follow exercise prescription today without complaint.  Will continue to monitor for progression. ? ? ? ?Dr. Emily Filbert is Medical Director for [facility].  ?Dr. Ottie Glazier is Medical Director for [facility] Pulmonary Rehabilitation. ?

## 2021-09-09 ENCOUNTER — Encounter: Payer: Self-pay | Admitting: *Deleted

## 2021-09-09 ENCOUNTER — Other Ambulatory Visit: Payer: Self-pay

## 2021-09-09 DIAGNOSIS — Z955 Presence of coronary angioplasty implant and graft: Secondary | ICD-10-CM

## 2021-09-09 DIAGNOSIS — I214 Non-ST elevation (NSTEMI) myocardial infarction: Secondary | ICD-10-CM
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
## 2021-09-09 NOTE — Progress Notes (Signed)
Daily Session Note ? ?Patient Details  ?Name: Jackie Robinson ?MRN: [id number] ?Date of Birth: [birth year] ?Referring Provider:   ?Flowsheet Row Cardiac Rehab from 07/07/2021 in [facility] Cardiac and Pulmonary Rehab  ?Referring Provider Donnelly Angelica MD  ? ?  ? ? ?Encounter Date: 09/09/2021 ? ?Check In: ? Session Check In - 09/09/21 1406   ? ?  ? Check-In  ? Supervising physician immediately available to respond to emergencies See telemetry face sheet for immediately available ER MD   ? Location [facility]-Cardiac & Pulmonary Rehab   ? Staff Present Birdie Sons, MPA, RN;Joseph Erie, RCP,RRT,BSRT;Kara Viburnum, MS, ASCM CEP, Exercise Physiologist   ? Virtual Visit No   ? Medication changes reported     No   ? Fall or balance concerns reported    No   ? Warm-up and Cool-down Performed on first and last piece of equipment   ? Resistance Training Performed Yes   ? VAD Patient? No   ? PAD/SET Patient? No   ?  ? Pain Assessment  ? Currently in Pain? No/denies   ? ?  ?  ? ?  ? ? ? ? ? ?Social History  ? ?Tobacco Use  ?Smoking Status Never  ?Smokeless Tobacco Never  ? ? ?Goals Met:  ?Independence with exercise equipment ?Exercise tolerated well ?No report of concerns or symptoms today ?Strength training completed today ? ?Goals Unmet:  ?Not Applicable ? ?Comments: Pt able to follow exercise prescription today without complaint.  Will continue to monitor for progression. ? ? ? ?Dr. Emily Filbert is Medical Director for [place].  ?Dr. Ottie Glazier is Medical Director for [facility] Pulmonary Rehabilitation. ?

## 2021-09-10 ENCOUNTER — Other Ambulatory Visit: Payer: Self-pay

## 2021-09-10 ENCOUNTER — Encounter: Payer: Medicare Other | Admitting: *Deleted

## 2021-09-10 DIAGNOSIS — I214 Non-ST elevation (NSTEMI) myocardial infarction: Secondary | ICD-10-CM

## 2021-09-10 DIAGNOSIS — Z955 Presence of coronary angioplasty implant and graft: Secondary | ICD-10-CM | POA: Diagnosis not present

## 2021-09-10 NOTE — Progress Notes (Signed)
Daily Session Note ? ?Patient Details  ?Name: Jackie Robinson ?MRN: [id number] ?Date of Birth: [birth year] ?Referring Provider:   ?Flowsheet Row Cardiac Rehab from 07/07/2021 in [facility] Cardiac and Pulmonary Rehab  ?Referring Provider Donnelly Angelica MD  ? ?  ? ? ?Encounter Date: 09/10/2021 ? ?Check In: ? Session Check In - 09/10/21 1413   ? ?  ? Check-In  ? Supervising physician immediately available to respond to emergencies See telemetry face sheet for immediately available ER MD   ? Location [facility]-Cardiac & Pulmonary Rehab   ? Staff Present Renita Papa, RN BSN;Joseph Peconic, RCP,RRT,BSRT;Jessica Argenta, [place], [place], [place], CCET   ? Virtual Visit No   ? Medication changes reported     No   ? Fall or balance concerns reported    No   ? Warm-up and Cool-down Performed on first and last piece of equipment   ? Resistance Training Performed Yes   ? VAD Patient? No   ? PAD/SET Patient? No   ?  ? Pain Assessment  ? Currently in Pain? No/denies   ? ?  ?  ? ?  ? ? ? ? ? ?Social History  ? ?Tobacco Use  ?Smoking Status Never  ?Smokeless Tobacco Never  ? ? ?Goals Met:  ?Independence with exercise equipment ?Exercise tolerated well ?No report of concerns or symptoms today ?Strength training completed today ? ?Goals Unmet:  ?Not Applicable ? ?Comments: Pt able to follow exercise prescription today without complaint.  Will continue to monitor for progression. ? ? ? ?Dr. Emily Filbert is Medical Director for [place].  ?Dr. Ottie Glazier is Medical Director for [facility] Pulmonary Rehabilitation. ?

## 2021-09-14 ENCOUNTER — Other Ambulatory Visit: Payer: Self-pay

## 2021-09-14 DIAGNOSIS — Z955 Presence of coronary angioplasty implant and graft: Secondary | ICD-10-CM | POA: Diagnosis not present

## 2021-09-14 DIAGNOSIS — I214 Non-ST elevation (NSTEMI) myocardial infarction: Secondary | ICD-10-CM

## 2021-09-14 NOTE — Progress Notes (Signed)
Daily Session Note ? ?Patient Details  ?Name: Jackie Robinson ?MRN: [id number] ?Date of Birth: 1955/08/07 ?Referring Provider:   ?Flowsheet Row Cardiac Rehab from 07/07/2021 in [facility] Cardiac and Pulmonary Rehab  ?Referring Provider Donnelly Angelica MD  ? ?  ? ? ?Encounter Date: 09/14/2021 ? ?Check In: ? Session Check In - 09/14/21 1354   ? ?  ? Check-In  ? Supervising physician immediately available to respond to emergencies See telemetry face sheet for immediately available ER MD   ? Location [facility]-Cardiac & Pulmonary Rehab   ? Staff Present Birdie Sons, MPA, RN;Joseph [place], RCP,RRT,BSRT;Kara Thayer, MS, ASCM CEP, Exercise Physiologist   ? Virtual Visit No   ? Medication changes reported     No   ? Fall or balance concerns reported    No   ? Warm-up and Cool-down Performed on first and last piece of equipment   ? Resistance Training Performed Yes   ? VAD Patient? No   ? PAD/SET Patient? No   ?  ? Pain Assessment  ? Currently in Pain? No/denies   ? ?  ?  ? ?  ? ? ? ? ? ?Social History  ? ?Tobacco Use  ?Smoking Status Never  ?Smokeless Tobacco Never  ? ? ?Goals Met:  ?Independence with exercise equipment ?Exercise tolerated well ?No report of concerns or symptoms today ?Strength training completed today ? ?Goals Unmet:  ?Not Applicable ? ?Comments: Pt able to follow exercise prescription today without complaint.  Will continue to monitor for progression. ? ? ? ?Dr. Emily Filbert is Medical Director for [place].  ?Dr. Ottie Glazier is Medical Director for [facility] Pulmonary Rehabilitation. ?

## 2021-09-16 DIAGNOSIS — Z955 Presence of coronary angioplasty implant and graft: Secondary | ICD-10-CM

## 2021-09-16 DIAGNOSIS — I214 Non-ST elevation (NSTEMI) myocardial infarction: Secondary | ICD-10-CM

## 2021-09-16 NOTE — Progress Notes (Signed)
Daily Session Note ? ?Patient Details  ?Name: Jackie Robinson ?MRN: [id number] ?Date of Birth: 1956/05/22 ?Referring Provider:   ?Flowsheet Row Cardiac Rehab from 07/07/2021 in [facility] Cardiac and Pulmonary Rehab  ?Referring Provider Donnelly Angelica MD  ? ?  ? ? ?Encounter Date: 09/16/2021 ? ?Check In: ? Session Check In - 09/16/21 1357   ? ?  ? Check-In  ? Supervising physician immediately available to respond to emergencies See telemetry face sheet for immediately available ER MD   ? Location [facility]-Cardiac & Pulmonary Rehab   ? Staff Present Birdie Sons, MPA, Nino Glow, MS, ASCM CEP, Exercise Physiologist;Joseph [place], [place]   ? Virtual Visit No   ? Medication changes reported     No   ? Fall or balance concerns reported    No   ? Warm-up and Cool-down Performed on first and last piece of equipment   ? Resistance Training Performed Yes   ? VAD Patient? No   ? PAD/SET Patient? No   ?  ? Pain Assessment  ? Currently in Pain? No/denies   ? ?  ?  ? ?  ? ? ? ? ? ?Social History  ? ?Tobacco Use  ?Smoking Status Never  ?Smokeless Tobacco Never  ? ? ?Goals Met:  ?Independence with exercise equipment ?Exercise tolerated well ?No report of concerns or symptoms today ?Strength training completed today ? ?Goals Unmet:  ?Not Applicable ? ?Comments: Pt able to follow exercise prescription today without complaint.  Will continue to monitor for progression. ? ? ? ?Dr. Emily Filbert is Medical Director for [place].  ?Dr. Ottie Glazier is Medical Director for [facility] Pulmonary Rehabilitation. ?
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
## 2021-10-07 ENCOUNTER — Encounter: Payer: Self-pay | Admitting: *Deleted

## 2021-10-07 DIAGNOSIS — Z955 Presence of coronary angioplasty implant and graft: Secondary | ICD-10-CM

## 2021-10-07 DIAGNOSIS — I214 Non-ST elevation (NSTEMI) myocardial infarction: Secondary | ICD-10-CM
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
## 2021-10-08 ENCOUNTER — Telehealth: Payer: Self-pay

## 2021-10-08 DIAGNOSIS — Z955 Presence of coronary angioplasty implant and graft: Secondary | ICD-10-CM

## 2021-10-08 DIAGNOSIS — I214 Non-ST elevation (NSTEMI) myocardial infarction: Secondary | ICD-10-CM

## 2021-10-08 NOTE — Telephone Encounter (Signed)
Patient called to let us know she finished her testing for her foot and is still using a walking boot as needed. She states her doctor said she can use regular shoes for periods of time if she needs to. She is aware she cannot wear a walking boot in rehab. She'd like to resume cardiac rehab- let her know we will modify and work with limited machines  and exercises to accommodate. Patient will return 4/24 unless anything changes in the meantime. ?

## 2021-10-12 ENCOUNTER — Encounter: Payer: Medicare Other | Attending: Cardiology

## 2021-10-12 DIAGNOSIS — I252 Old myocardial infarction: Secondary | ICD-10-CM | POA: Insufficient documentation

## 2021-10-12 DIAGNOSIS — Z955 Presence of coronary angioplasty implant and graft: Secondary | ICD-10-CM | POA: Diagnosis present

## 2021-10-12 DIAGNOSIS — I214 Non-ST elevation (NSTEMI) myocardial infarction: Secondary | ICD-10-CM

## 2021-10-12 NOTE — Progress Notes (Signed)
Daily Session Note ? ?Patient Details  ?Name: Jackie Robinson ?MRN: [id number] ?Date of Birth: 12-Jul-1955 ?Referring Provider:   ?Flowsheet Row Cardiac Rehab from 07/07/2021 in [facility] Cardiac and Pulmonary Rehab  ?Referring Provider Donnelly Angelica MD  ? ?  ? ? ?Encounter Date: 10/12/2021 ? ?Check In: ? Session Check In - 10/12/21 1408   ? ?  ? Check-In  ? Supervising physician immediately available to respond to emergencies See telemetry face sheet for immediately available ER MD   ? Location [facility]-Cardiac & Pulmonary Rehab   ? Staff Present Birdie Sons, MPA, RN;Joseph [place], RCP,RRT,BSRT;Kara [place], MS, ASCM CEP, Exercise Physiologist   ? Virtual Visit No   ? Medication changes reported     No   ? Fall or balance concerns reported    No   ? Warm-up and Cool-down Performed on first and last piece of equipment   ? Resistance Training Performed Yes   ? VAD Patient? No   ? PAD/SET Patient? No   ?  ? Pain Assessment  ? Currently in Pain? No/denies   ? ?  ?  ? ?  ? ? ? ? ? ?Social History  ? ?Tobacco Use  ?Smoking Status Never  ?Smokeless Tobacco Never  ? ? ?Goals Met:  ?Independence with exercise equipment ?Exercise tolerated well ?No report of concerns or symptoms today ?Strength training completed today ? ?Goals Unmet:  ?Not Applicable ? ?Comments: Pt able to follow exercise prescription today without complaint.  Will continue to monitor for progression. ? ? ? ?Dr. Emily Filbert is Medical Director for [place].  ?Dr. Ottie Glazier is Medical Director for [facility] Pulmonary Rehabilitation. ?

## 2021-10-14 DIAGNOSIS — Z955 Presence of coronary angioplasty implant and graft: Secondary | ICD-10-CM

## 2021-10-14 DIAGNOSIS — I214 Non-ST elevation (NSTEMI) myocardial infarction: Secondary | ICD-10-CM

## 2021-10-14 NOTE — Progress Notes (Signed)
Daily Session Note ? ?Patient Details  ?Name: Jackie Robinson ?MRN: [id number] ?Date of Birth: 06-08-1956 ?Referring Provider:   ?Flowsheet Row Cardiac Rehab from 07/07/2021 in [facility] Cardiac and Pulmonary Rehab  ?Referring Provider Donnelly Angelica MD  ? ?  ? ? ?Encounter Date: 10/14/2021 ? ?Check In: ? Session Check In - 10/14/21 1400   ? ?  ? Check-In  ? Supervising physician immediately available to respond to emergencies See telemetry face sheet for immediately available ER MD   ? Location [facility]-Cardiac & Pulmonary Rehab   ? Staff Present Birdie Sons, MPA, RN;Joseph [place], RCP,RRT,BSRT;Kara [place], MS, ASCM CEP, Exercise Physiologist   ? Virtual Visit No   ? Medication changes reported     No   ? Fall or balance concerns reported    No   ? Warm-up and Cool-down Performed on first and last piece of equipment   ? Resistance Training Performed Yes   ? VAD Patient? No   ? PAD/SET Patient? No   ?  ? Pain Assessment  ? Currently in Pain? No/denies   ? ?  ?  ? ?  ? ? ? ? ? ?Social History  ? ?Tobacco Use  ?Smoking Status Never  ?Smokeless Tobacco Never  ? ? ?Goals Met:  ?Independence with exercise equipment ?Exercise tolerated well ?No report of concerns or symptoms today ?Strength training completed today ? ?Goals Unmet:  ?Not Applicable ? ?Comments: Pt able to follow exercise prescription today without complaint.  Will continue to monitor for progression. ? ? ? ?Dr. Emily Filbert is Medical Director for [place].  ?Dr. Ottie Glazier is Medical Director for [facility] Pulmonary Rehabilitation. ?

## 2021-10-15 DIAGNOSIS — Z955 Presence of coronary angioplasty implant and graft: Secondary | ICD-10-CM

## 2021-10-15 DIAGNOSIS — I214 Non-ST elevation (NSTEMI) myocardial infarction: Secondary | ICD-10-CM

## 2021-10-15 NOTE — Progress Notes (Signed)
Daily Session Note ? ?Patient Details  ?Name: Jackie Robinson ?MRN: [id number] ?Date of Birth: 1955-10-30 ?Referring Provider:   ?Flowsheet Row Cardiac Rehab from 07/07/2021 in [facility] Cardiac and Pulmonary Rehab  ?Referring Provider Donnelly Angelica MD  ? ?  ? ? ?Encounter Date: 10/15/2021 ? ?Check In: ? Session Check In - 10/15/21 1422   ? ?  ? Check-In  ? Supervising physician immediately available to respond to emergencies See telemetry face sheet for immediately available ER MD   ? Location [facility]-Cardiac & Pulmonary Rehab   ? Staff Present Alberteen Sam, MA, RCEP, CCRP, CCET;Werner Labella, RN, BSN;Joseph [place], [place]   ? Virtual Visit No   ? Medication changes reported     No   ? Fall or balance concerns reported    No   ? Warm-up and Cool-down Performed on first and last piece of equipment   ? Resistance Training Performed Yes   ? VAD Patient? No   ? PAD/SET Patient? No   ?  ? Pain Assessment  ? Currently in Pain? No/denies   ? ?  ?  ? ?  ? ? ? ? ? ?Social History  ? ?Tobacco Use  ?Smoking Status Never  ?Smokeless Tobacco Never  ? ? ?Goals Met:  ?Independence with exercise equipment ?Exercise tolerated well ?No report of concerns or symptoms today ?Strength training completed today ? ?Goals Unmet:  ?Not Applicable ? ?Comments: Pt able to follow exercise prescription today without complaint.  Will continue to monitor for progression. ? ?Dr. Emily Filbert is Medical Director for [place].  ?Dr. Ottie Glazier is Medical Director for [facility] Pulmonary Rehabilitation. ?

## 2021-10-19 ENCOUNTER — Encounter: Payer: Medicare Other | Attending: Cardiology

## 2021-10-19 DIAGNOSIS — I214 Non-ST elevation (NSTEMI) myocardial infarction: Secondary | ICD-10-CM | POA: Diagnosis present

## 2021-10-19 DIAGNOSIS — Z955 Presence of coronary angioplasty implant and graft: Secondary | ICD-10-CM | POA: Insufficient documentation

## 2021-10-19 DIAGNOSIS — Z48812 Encounter for surgical aftercare following surgery on the circulatory system: Secondary | ICD-10-CM | POA: Insufficient documentation

## 2021-10-19 NOTE — Progress Notes (Signed)
Daily Session Note ? ?Patient Details  ?Name: Jackie Robinson ?MRN: [id number] ?Date of Birth: 05-Sep-1955 ?Referring Provider:   ?Flowsheet Row Cardiac Rehab from 07/07/2021 in [facility] Cardiac and Pulmonary Rehab  ?Referring Provider Donnelly Angelica MD  ? ?  ? ? ?Encounter Date: 10/19/2021 ? ?Check In: ? Session Check In - 10/19/21 1410   ? ?  ? Check-In  ? Supervising physician immediately available to respond to emergencies See telemetry face sheet for immediately available ER MD   ? Location [facility]-Cardiac & Pulmonary Rehab   ? Staff Present Birdie Sons, MPA, RN;Joseph [place], [place], MS, ASCM CEP, Exercise Physiologist;Jessica Luan [place], MA, RCEP, CCRP, CCET   ? Virtual Visit No   ? Medication changes reported     No   ? Fall or balance concerns reported    No   ? Warm-up and Cool-down Performed on first and last piece of equipment   ? Resistance Training Performed Yes   ? VAD Patient? No   ? PAD/SET Patient? No   ?  ? Pain Assessment  ? Currently in Pain? No/denies   ? ?  ?  ? ?  ? ? ? ? ? ?Social History  ? ?Tobacco Use  ?Smoking Status Never  ?Smokeless Tobacco Never  ? ? ?Goals Met:  ?Independence with exercise equipment ?Exercise tolerated well ?No report of concerns or symptoms today ?Strength training completed today ? ?Goals Unmet:  ?Not Applicable ? ?Comments: Pt able to follow exercise prescription today without complaint.  Will continue to monitor for progression. ? ? ? ?Dr. Emily Filbert is Medical Director for [place].  ?Dr. Ottie Glazier is Medical Director for [facility] Pulmonary Rehabilitation. ?

## 2021-10-21 DIAGNOSIS — Z48812 Encounter for surgical aftercare following surgery on the circulatory system: Secondary | ICD-10-CM | POA: Diagnosis not present

## 2021-10-21 DIAGNOSIS — I214 Non-ST elevation (NSTEMI) myocardial infarction: Secondary | ICD-10-CM

## 2021-10-21 DIAGNOSIS — Z955 Presence of coronary angioplasty implant and graft: Secondary | ICD-10-CM

## 2021-10-21 NOTE — Progress Notes (Signed)
Daily Session Note ? ?Patient Details  ?Name: Jackie Robinson ?MRN: [id number] ?Date of Birth: 1956/05/27 ?Referring Provider:   ?Flowsheet Row Cardiac Rehab from 07/07/2021 in [facility] Cardiac and Pulmonary Rehab  ?Referring Provider Donnelly Angelica MD  ? ?  ? ? ?Encounter Date: 10/21/2021 ? ?Check In: ? Session Check In - 10/21/21 1348   ? ?  ? Check-In  ? Supervising physician immediately available to respond to emergencies See telemetry face sheet for immediately available ER MD   ? Location [facility]-Cardiac & Pulmonary Rehab   ? Staff Present Birdie Sons, MPA, RN;Joseph [place], RCP,RRT,BSRT;Melissa [place], RDN, LDN   ? Virtual Visit No   ? Medication changes reported     No   ? Fall or balance concerns reported    No   ? Warm-up and Cool-down Performed on first and last piece of equipment   ? Resistance Training Performed Yes   ? VAD Patient? No   ? PAD/SET Patient? No   ?  ? Pain Assessment  ? Currently in Pain? No/denies   ? ?  ?  ? ?  ? ? ? ? ? ?Social History  ? ?Tobacco Use  ?Smoking Status Never  ?Smokeless Tobacco Never  ? ? ?Goals Met:  ?Independence with exercise equipment ?Exercise tolerated well ?No report of concerns or symptoms today ?Strength training completed today ? ?Goals Unmet:  ?Not Applicable ? ?Comments: Pt able to follow exercise prescription today without complaint.  Will continue to monitor for progression. ? ? ? ?Dr. Emily Filbert is Medical Director for [place].  ?Dr. Ottie Glazier is Medical Director for [facility] Pulmonary Rehabilitation. ?

## 2021-10-22 ENCOUNTER — Encounter: Payer: Medicare Other | Admitting: *Deleted

## 2021-10-22 DIAGNOSIS — Z48812 Encounter for surgical aftercare following surgery on the circulatory system: Secondary | ICD-10-CM | POA: Diagnosis not present

## 2021-10-22 DIAGNOSIS — I214 Non-ST elevation (NSTEMI) myocardial infarction: Secondary | ICD-10-CM

## 2021-10-22 DIAGNOSIS — Z955 Presence of coronary angioplasty implant and graft: Secondary | ICD-10-CM

## 2021-10-22 NOTE — Progress Notes (Signed)
Daily Session Note ? ?Patient Details  ?Name: Jackie Robinson ?MRN: [id number] ?Date of Birth: [birth year] ?Referring Provider:   ?Flowsheet Row Cardiac Rehab from 07/07/2021 in [facility] Cardiac and Pulmonary Rehab  ?Referring Provider Donnelly Angelica MD  ? ?  ? ? ?Encounter Date: 10/22/2021 ? ?Check In: ? Session Check In - 10/22/21 1414   ? ?  ? Check-In  ? Supervising physician immediately available to respond to emergencies See telemetry face sheet for immediately available ER MD   ? Location [facility]-Cardiac & Pulmonary Rehab   ? Staff Present Heath Lark, RN, BSN, CCRP;Joseph [place], RCP,RRT,BSRT;Melissa [place], [place], LDN   ? Virtual Visit No   ? Medication changes reported     No   ? Fall or balance concerns reported    No   ? Warm-up and Cool-down Performed on first and last piece of equipment   ? Resistance Training Performed Yes   ? VAD Patient? No   ? PAD/SET Patient? No   ?  ? Pain Assessment  ? Currently in Pain? No/denies   ? ?  ?  ? ?  ? ? ? ? ? ?Social History  ? ?Tobacco Use  ?Smoking Status Never  ?Smokeless Tobacco Never  ? ? ?Goals Met:  ?Independence with exercise equipment ?Exercise tolerated well ?No report of concerns or symptoms today ? ?Goals Unmet:  ?Not Applicable ? ?Comments: Pt able to follow exercise prescription today without complaint.  Will continue to monitor for progression. ? ? ? ?Dr. Emily Filbert is Medical Director for [place].  ?Dr. Ottie Glazier is Medical Director for [facility] Pulmonary Rehabilitation. ?

## 2021-10-26 DIAGNOSIS — Z48812 Encounter for surgical aftercare following surgery on the circulatory system: Secondary | ICD-10-CM | POA: Diagnosis not present

## 2021-10-26 DIAGNOSIS — I214 Non-ST elevation (NSTEMI) myocardial infarction: Secondary | ICD-10-CM

## 2021-10-26 DIAGNOSIS — Z955 Presence of coronary angioplasty implant and graft: Secondary | ICD-10-CM

## 2021-10-26 NOTE — Progress Notes (Signed)
Daily Session Note ? ?Patient Details  ?Name: Jackie Robinson ?MRN: [id number] ?Date of Birth: 10/16/1955 ?Referring Provider:   ?Flowsheet Row Cardiac Rehab from 07/07/2021 in [facility] Cardiac and Pulmonary Rehab  ?Referring Provider Donnelly Angelica MD  ? ?  ? ? ?Encounter Date: 10/26/2021 ? ?Check In: ? Session Check In - 10/26/21 1405   ? ?  ? Check-In  ? Supervising physician immediately available to respond to emergencies See telemetry face sheet for immediately available ER MD   ? Location [facility]-Cardiac & Pulmonary Rehab   ? Staff Present Birdie Sons, MPA, RN;Melissa [place], RDN, LDN;Jessica [place], MA, RCEP, CCRP, Marylynn Pearson, MS, ASCM CEP, Exercise Physiologist   ? Virtual Visit No   ? Medication changes reported     No   ? Fall or balance concerns reported    No   ? Warm-up and Cool-down Performed on first and last piece of equipment   ? Resistance Training Performed Yes   ? VAD Patient? No   ? PAD/SET Patient? No   ?  ? Pain Assessment  ? Currently in Pain? No/denies   ? ?  ?  ? ?  ? ? ? ? ? ?Social History  ? ?Tobacco Use  ?Smoking Status Never  ?Smokeless Tobacco Never  ? ? ?Goals Met:  ?Independence with exercise equipment ?Exercise tolerated well ?No report of concerns or symptoms today ?Strength training completed today ? ?Goals Unmet:  ?Not Applicable ? ?Comments: Pt able to follow exercise prescription today without complaint.  Will continue to monitor for progression. ? ? ? ?Dr. Emily Filbert is Medical Director for [place].  ?Dr. Ottie Glazier is Medical Director for [facility] Pulmonary Rehabilitation. ?

## 2021-10-28 DIAGNOSIS — I214 Non-ST elevation (NSTEMI) myocardial infarction: Secondary | ICD-10-CM

## 2021-10-28 DIAGNOSIS — Z955 Presence of coronary angioplasty implant and graft: Secondary | ICD-10-CM

## 2021-10-28 DIAGNOSIS — Z48812 Encounter for surgical aftercare following surgery on the circulatory system: Secondary | ICD-10-CM | POA: Diagnosis not present

## 2021-10-28 NOTE — Progress Notes (Signed)
Daily Session Note ? ?Patient Details  ?Name: NEEVA TREW ?MRN: [id number] ?Date of Birth: 1955-09-16 ?Referring Provider:   ?Flowsheet Row Cardiac Rehab from 07/07/2021 in [facility] Cardiac and Pulmonary Rehab  ?Referring Provider Donnelly Angelica MD  ? ?  ? ? ?Encounter Date: 10/28/2021 ? ?Check In: ? Session Check In - 10/28/21 1353   ? ?  ? Check-In  ? Supervising physician immediately available to respond to emergencies See telemetry face sheet for immediately available ER MD   ? Location [facility]-Cardiac & Pulmonary Rehab   ? Staff Present Birdie Sons, MPA, RN;Melissa [employer], RDN, LDN;Joseph [place], RCP,RRT,BSRT   ? Virtual Visit No   ? Medication changes reported     No   ? Fall or balance concerns reported    No   ? Warm-up and Cool-down Performed on first and last piece of equipment   ? Resistance Training Performed Yes   ? VAD Patient? No   ? PAD/SET Patient? No   ?  ? Pain Assessment  ? Currently in Pain? No/denies   ? ?  ?  ? ?  ? ? ? ? ? ?Social History  ? ?Tobacco Use  ?Smoking Status Never  ?Smokeless Tobacco Never  ? ? ?Goals Met:  ?Independence with exercise equipment ?Exercise tolerated well ?No report of concerns or symptoms today ?Strength training completed today ? ?Goals Unmet:  ?Not Applicable ? ?Comments: Pt able to follow exercise prescription today without complaint.  Will continue to monitor for progression. ? ? ?Dr. Emily Filbert is Medical Director for [place].  ?Dr. Ottie Glazier is Medical Director for [facility] Pulmonary Rehabilitation. ?

## 2021-10-29 DIAGNOSIS — Z48812 Encounter for surgical aftercare following surgery on the circulatory system: Secondary | ICD-10-CM | POA: Diagnosis not present

## 2021-10-29 DIAGNOSIS — I214 Non-ST elevation (NSTEMI) myocardial infarction: Secondary | ICD-10-CM

## 2021-10-29 DIAGNOSIS — Z955 Presence of coronary angioplasty implant and graft: Secondary | ICD-10-CM

## 2021-10-29 NOTE — Patient Instructions (Signed)
Discharge Patient Instructions ? ?Patient Details  ?Name: Jackie Robinson ?MRN: [id number] ?Date of Birth: 08-26-1955 ?Referring Provider:  Andrez Grime, MD ? ? ?Number of Visits: 76 ? ?Reason for Discharge:  ?Patient reached a stable level of exercise. ?Patient independent in their exercise. ?Patient has met program and personal goals. ? ?Smoking History:  ?Social History  ? ?Tobacco Use  ?Smoking Status Never  ?Smokeless Tobacco Never  ? ? ?Diagnosis:  ?NSTEMI (non-ST elevation myocardial infarction) (Experiment) ? ?Status post coronary artery stent placement ? ?Initial Exercise Prescription: ? Initial Exercise Prescription - 07/07/21 1400   ? ?  ? Date of Initial Exercise RX and Referring Provider  ? Date 07/07/21   ? Referring Provider Jackie Angelica MD   ?  ? Oxygen  ? Maintain Oxygen Saturation 88% or higher   ?  ? Recumbant Bike  ? Level 2   ? RPM 60   ? Watts 25   ? Minutes 15   ? METs 2.6   ?  ? REL-XR  ? Level 2   ? Speed 50   ? Minutes 15   ? METs 2.6   ?  ? Track  ? Laps 26   ? Minutes 15   ? METs 2.41   ?  ? Prescription Details  ? Frequency (times per week) 3   ? Duration Progress to 30 minutes of continuous aerobic without signs/symptoms of physical distress   ?  ? Intensity  ? THRR 40-80% of Max Heartrate 114-141   ? Ratings of Perceived Exertion 11-13   ? Perceived Dyspnea 0-4   ?  ? Progression  ? Progression Continue to progress workloads to maintain intensity without signs/symptoms of physical distress.   ?  ? Resistance Training  ? Training Prescription Yes   ? Weight 3 lb   ? Reps 10-15   ? ?  ?  ? ?  ? ? ?Discharge Exercise Prescription (Final Exercise Prescription Changes): ? Exercise Prescription Changes - 10/27/21 0900   ? ?  ? Response to Exercise  ? Blood Pressure (Admit) 118/60   ? Blood Pressure (Exit) 104/60   ? Heart Rate (Admit) 90 bpm   ? Heart Rate (Exercise) 101 bpm   ? Heart Rate (Exit) 101 bpm   ? Rating of Perceived Exertion (Exercise) 13   ? Symptoms none   ? Comments wearing boot  outside of class, seated equipment only   ? Duration Continue with 30 min of aerobic exercise without signs/symptoms of physical distress.   ? Intensity THRR unchanged   ?  ? Progression  ? Progression Continue to progress workloads to maintain intensity without signs/symptoms of physical distress.   ? Average METs 2.03   ?  ? Resistance Training  ? Training Prescription Yes   ? Weight 3 lb   ? Reps 10-15   ?  ? Interval Training  ? Interval Training No   ?  ? NuStep  ? Level 3   ? Minutes 30   ? METs 1.9   ?  ? REL-XR  ? Level 3   ? Minutes 30   ? METs 2.4   ?  ? Home Exercise Plan  ? Plans to continue exercise at Home (comment)   walking, staff videos  ? Frequency Add 2 additional days to program exercise sessions.   ? Initial Home Exercises Provided 07/30/21   ?  ? Oxygen  ? Maintain Oxygen Saturation 88% or higher   ? ?  ?  ? ?  ? ? ?  Functional Capacity: ? 6 Minute Walk   ? ? Jackie Robinson Name 07/07/21 1410  ?  ?  ?  ? 6 Minute Walk  ? Phase Initial    ? Distance 995 feet    ? Walk Time 6 minutes    ? # of Rest Breaks 0    ? MPH 1.88    ? METS 2.61    ? RPE 10    ? Perceived Dyspnea  0    ? VO2 Peak 9.16    ? Symptoms No    ? Resting HR 87 bpm    ? Resting BP 122/78    ? Resting Oxygen Saturation  98 %    ? Exercise Oxygen Saturation  during 6 min walk 98 %    ? Max Ex. HR 109 bpm    ? Max Ex. BP 144/80    ? 2 Minute Post BP 118/82    ? ?  ?  ? ?  ? ? ? ?Nutrition & Weight - Outcomes: ? Pre Biometrics - 07/07/21 1352   ? ?  ? Pre Biometrics  ? Height $Remove'5\' 6"'IeeGGBh$  (1.676 m)   ? Weight 191 lb 14.4 oz (87 kg)   ? BMI (Calculated) 30.99   ? Single Leg Stand 8.4 seconds   ? ?  ?  ? ?  ? ? ? ?Nutrition: ? Nutrition Therapy & Goals - 07/27/21 1432   ? ?  ? Nutrition Therapy  ? Diet Heart healthy, low Na   ? Drug/Food Interactions Statins/Certain Fruits   ? Protein (specify units) 65g   ? Fiber 25 grams   ? Whole Grain Foods 3 servings   pt limiting bread  ? Saturated Fats 12 max. grams   ? Fruits and Vegetables 8 servings/day   ?  Sodium 2 grams   ?  ? Personal Nutrition Goals  ? Nutrition Goal ST: add in some dark leafy greens, ripple pea-protein plant milk with cereal, add walnuts to diet. LT: limit Na <2g, limit eating out <3x/week, include at least 8 fruit/vegetable servings per day, include source of calcium and omega-3 fatty acids into routine   ? Comments 66 y.o. F admitted to rehab s/p stent placement and NSTEMI also presenting with HTN, hypothyroid, prediabetes, GERD, osteoporosis. Relevant medications include metformin, crestor, protonix, probiotics, vit D, vit E, synthroid.  PYP Score: 66. Vegetables & Fruits 5/12. Breads, Grains & Cereals 8/12. Red & Processed Meat 12/12. Poultry 2/2. Fish & Shellfish 1/4. Beans, Nuts & Seeds 0/4. Milk & Dairy Foods 5/6. Toppings, Oils, Seasonings & Salt 19/20. Sweets, Snacks & Restaurant Food 6/14. Beverages 8/10. She feels she needs to eat more vegetables and fruit. She feels that bread sometimes "gets stuck" in her stomach and will sometimes have to go to the ER so she limits it. For snacks she has carrots or celery and hummus. She has salmon during the week 1-2x/month. She will go out to eat 5x/week; for example: wendys - apple pecan salad, chik-fil-a - grilled nuggets or salad (grilled market salad). She tries to get vegetables and lean protein. B: special K with dried strawberries or strawberries with almond S: veggies and hummus, mini pizza every once in a while, frozen dinners lower in Na that her son gets D: goes out to eat. Discussed heart healthy eating. Suggested adding in some dark leafy greens, ripple pea-protein plant milk with cereal, and walnuts to diet.   ?  ? Intervention Plan  ? Intervention Prescribe, educate and  counsel regarding individualized specific dietary modifications aiming towards targeted core components such as weight, hypertension, lipid management, diabetes, heart failure and other comorbidities.   ? Expected Outcomes Short Term Goal: Understand basic principles  of dietary content, such as calories, fat, sodium, cholesterol and nutrients.;Short Term Goal: A plan has been developed with personal nutrition goals set during dietitian appointment.;Long Term Goal: Adherence to prescribed nutrition plan.   ? ?  ?  ? ?  ? ? ? ?Goals reviewed with patient; copy given to patient. ?

## 2021-10-29 NOTE — Progress Notes (Signed)
Daily Session Note ? ?Patient Details  ?Name: Jackie Robinson ?MRN: [id number] ?Date of Birth: 08/15/1955 ?Referring Provider:   ?Flowsheet Row Cardiac Rehab from 07/07/2021 in [facility] Cardiac and Pulmonary Rehab  ?Referring Provider Donnelly Angelica MD  ? ?  ? ? ?Encounter Date: 10/29/2021 ? ?Check In: ? Session Check In - 10/29/21 1346   ? ?  ? Check-In  ? Supervising physician immediately available to respond to emergencies See telemetry face sheet for immediately available ER MD   ? Location [facility]-Cardiac & Pulmonary Rehab   ? Staff Present Alberteen Sam, MA, RCEP, CCRP, CCET;Zorianna Taliaferro, RN, BSN;Joseph [place], [place]   ? Virtual Visit No   ? Medication changes reported     No   ? Fall or balance concerns reported    No   ? Warm-up and Cool-down Performed on first and last piece of equipment   ? Resistance Training Performed Yes   ? VAD Patient? No   ? PAD/SET Patient? No   ?  ? Pain Assessment  ? Currently in Pain? No/denies   ? ?  ?  ? ?  ? ? ? ? ? ?Social History  ? ?Tobacco Use  ?Smoking Status Never  ?Smokeless Tobacco Never  ? ? ?Goals Met:  ?Independence with exercise equipment ?Exercise tolerated well ?No report of concerns or symptoms today ?Strength training completed today ? ?Goals Unmet:  ?Not Applicable ? ?Comments: Pt able to follow exercise prescription today without complaint.  Will continue to monitor for progression. ? ? ?Dr. Emily Filbert is Medical Director for [place].  ?Dr. Ottie Glazier is Medical Director for [facility] Pulmonary Rehabilitation. ?

## 2021-11-02 DIAGNOSIS — I214 Non-ST elevation (NSTEMI) myocardial infarction: Secondary | ICD-10-CM

## 2021-11-02 DIAGNOSIS — Z48812 Encounter for surgical aftercare following surgery on the circulatory system: Secondary | ICD-10-CM | POA: Diagnosis not present

## 2021-11-02 DIAGNOSIS — Z955 Presence of coronary angioplasty implant and graft: Secondary | ICD-10-CM

## 2021-11-02 NOTE — Progress Notes (Signed)
Daily Session Note ? ?Patient Details  ?Name: Jackie Robinson ?MRN: [id number] ?Date of Birth: October 01, 1955 ?Referring Provider:   ?Flowsheet Row Cardiac Rehab from 07/07/2021 in [facility] Cardiac and Pulmonary Rehab  ?Referring Provider Donnelly Angelica MD  ? ?  ? ? ?Encounter Date: 11/02/2021 ? ?Check In: ? Session Check In - 11/02/21 1403   ? ?  ? Check-In  ? Supervising physician immediately available to respond to emergencies See telemetry face sheet for immediately available ER MD   ? Location [facility]-Cardiac & Pulmonary Rehab   ? Staff Present Birdie Sons, MPA, Nino Glow, MS, ASCM CEP, Exercise Physiologist;Joseph [place], [place]   ? Virtual Visit No   ? Medication changes reported     No   ? Fall or balance concerns reported    No   ? Warm-up and Cool-down Performed on first and last piece of equipment   ? Resistance Training Performed Yes   ? VAD Patient? No   ? PAD/SET Patient? No   ?  ? Pain Assessment  ? Currently in Pain? No/denies   ? ?  ?  ? ?  ? ? ? ? ? ?Social History  ? ?Tobacco Use  ?Smoking Status Never  ?Smokeless Tobacco Never  ? ? ?Goals Met:  ?Independence with exercise equipment ?Exercise tolerated well ?No report of concerns or symptoms today ?Strength training completed today ? ?Goals Unmet:  ?Not Applicable ? ?Comments: Pt able to follow exercise prescription today without complaint.  Will continue to monitor for progression. ? ? ? ?Dr. Emily Filbert is Medical Director for [place].  ?Dr. Ottie Glazier is Medical Director for [facility] Pulmonary Rehabilitation. ?

## 2021-11-04 ENCOUNTER — Encounter: Payer: Self-pay | Admitting: *Deleted

## 2021-11-04 DIAGNOSIS — Z955 Presence of coronary angioplasty implant and graft: Secondary | ICD-10-CM

## 2021-11-04 DIAGNOSIS — I214 Non-ST elevation (NSTEMI) myocardial infarction: Secondary | ICD-10-CM

## 2021-11-04 DIAGNOSIS — Z48812 Encounter for surgical aftercare following surgery on the circulatory system: Secondary | ICD-10-CM | POA: Diagnosis not present

## 2021-11-04 NOTE — Progress Notes (Signed)
Daily Session Note ? ?Patient Details  ?Name: Jackie Robinson ?MRN: [id number] ?Date of Birth: 1955-11-18 ?Referring Provider:   ?Flowsheet Row Cardiac Rehab from 07/07/2021 in [facility] Cardiac and Pulmonary Rehab  ?Referring Provider Donnelly Angelica MD  ? ?  ? ? ?Encounter Date: 11/04/2021 ? ?Check In: ? Session Check In - 11/04/21 1347   ? ?  ? Check-In  ? Supervising physician immediately available to respond to emergencies See telemetry face sheet for immediately available ER MD   ? Location [facility]-Cardiac & Pulmonary Rehab   ? Staff Present Birdie Sons, MPA, RN;Melissa [place], RDN, LDN;Joseph [place], RCP,RRT,BSRT   ? Virtual Visit No   ? Medication changes reported     No   ? Fall or balance concerns reported    No   ? Warm-up and Cool-down Performed on first and last piece of equipment   ? Resistance Training Performed Yes   ? VAD Patient? No   ? PAD/SET Patient? No   ?  ? Pain Assessment  ? Currently in Pain? No/denies   ? ?  ?  ? ?  ? ? ? ? ? ?Social History  ? ?Tobacco Use  ?Smoking Status Never  ?Smokeless Tobacco Never  ? ? ?Goals Met:  ?Independence with exercise equipment ?Exercise tolerated well ?No report of concerns or symptoms today ?Strength training completed today ? ?Goals Unmet:  ?Not Applicable ? ?Comments: Pt able to follow exercise prescription today without complaint.  Will continue to monitor for progression. ? ? ? ?Dr. Emily Filbert is Medical Director for [place].  ?Dr. Ottie Glazier is Medical Director for [facility] Pulmonary Rehabilitation. ?
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
## 2021-11-05 DIAGNOSIS — Z955 Presence of coronary angioplasty implant and graft: Secondary | ICD-10-CM

## 2021-11-05 DIAGNOSIS — I214 Non-ST elevation (NSTEMI) myocardial infarction: Secondary | ICD-10-CM

## 2021-11-05 DIAGNOSIS — Z48812 Encounter for surgical aftercare following surgery on the circulatory system: Secondary | ICD-10-CM | POA: Diagnosis not present

## 2021-11-05 NOTE — Progress Notes (Signed)
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 for good health and room for improvement. 51-60 More healthful dietary pattern, with some room for improvement.  >60 Healthy dietary pattern, although there may be some specific behaviors that could be improved.    Nutrition Goals Re-Evaluation:  Nutrition Goals Re-Evaluation     [place] Name 09/02/21 1423 10/21/21 1423           Goals   Nutrition Goal ST: add in some dark leafy greens, ripple pea-protein plant milk with cereal, add walnuts to diet. LT: limit Na <2g, limit eating out <3x/week, include at least 8 fruit/vegetable servings per day, include source of calcium and omega-3 fatty acids into routine ST: add in some dark leafy greens, ripple pea-protein plant milk with cereal, add walnuts to diet. LT: limit Na <2g, limit eating out <3x/week, include at least 8 fruit/vegetable servings per day, include source of calcium and omega-3 fatty acids into routine      Comment Pat is doing well in rehab.  She  has been consistent with her diet and trying to get enough protein.  She is aiming for heart healthy. Jackie Robinson is eating more greens in her diet by incorporating more raw portions in her meals. She thought she was getting a reaction to nuts she she has been using sunbutter instead. She is trying to eat more grilled salmon to help increase her omega intake. She is encouraged to make more changes down the road      Expected Outcome Short: Conitnue to focus on healthy eating Long: Continue to improve diet Short: Continue follow RD recommendations  Long:Continue to eat heart healthy diet               Nutrition Goals Discharge (Final Nutrition Goals Re-Evaluation):  Nutrition Goals Re-Evaluation - 10/21/21 1423       Goals   Nutrition Goal ST: add in some dark leafy greens, ripple pea-protein plant milk with cereal, add walnuts to diet. LT: limit Na <2g, limit eating out <3x/week, include at least 8 fruit/vegetable servings per day, include source of calcium and omega-3 fatty acids into routine    Comment Larrisha is eating more greens in her diet by incorporating more raw portions in her meals. She thought she was getting a reaction to nuts she she has been using sunbutter instead. She is trying to eat more grilled salmon to help increase her omega intake. She is encouraged to make more changes down the road    Expected Outcome Short: Continue follow RD recommendations Long:Continue to eat heart healthy diet             Psychosocial: Target Goals: Acknowledge presence or absence of significant depression and/or stress, maximize coping skills, provide positive support system. Participant is able to verbalize types and ability to use techniques and skills needed for reducing stress and depression.   Education: Stress, Anxiety, and Depression - Group verbal and visual presentation to define topics covered.  Reviews how body is impacted by stress, anxiety, and depression.  Also discusses healthy ways  to reduce stress and to treat/manage anxiety and depression.  Written material given at graduation.   Education: Sleep Hygiene -Provides group verbal and written instruction about how sleep can affect your health.  Define sleep hygiene, discuss sleep cycles and impact of sleep habits. Review good sleep hygiene tips.    Initial Review & Psychosocial Screening:  Initial Psych Review & Screening - 06/23/21 0950       Initial Review   Current issues with None Identified      Family Dynamics   Good Support System? Yes   Son in [place], [place] in [place].     Barriers   Psychosocial barriers to participate in program There are no identifiable barriers or psychosocial needs.      Screening Interventions   Interventions Encouraged to exercise    Expected Outcomes Short Term goal: Utilizing psychosocial counselor, staff and physician to assist with identification of specific Stressors or current issues interfering with healing process. Setting desired goal for each stressor or current issue identified.;Long Term Goal: Stressors or current issues are controlled or eliminated.;Short Term goal: Identification and review with participant of any Quality of Life or Depression concerns found by scoring the questionnaire.;Long Term goal: The participant improves quality of Life and PHQ9 Scores as seen by post scores and/or verbalization of changes             Quality of Life Scores:   Quality of Life - 11/04/21 1349       Quality of Life Scores   Health/Function Pre 16.71 %    Health/Function Post 23.8 %    Health/Function % Change 42.43 %    Socioeconomic Pre 28 %    Socioeconomic Post 26.25 %    Socioeconomic % Change  -6.25 %    Psych/Spiritual Pre 22.29 %    Psych/Spiritual Post 24.86 %    Psych/Spiritual % Change 11.53 %    Family Pre 27.6 %    Family Post 27.6 %    Family % Change 0 %    GLOBAL Pre  21.75 %    GLOBAL Post 25.11 %    GLOBAL % Change 15.45 %            Scores of 19  and below usually indicate a poorer quality of life in these areas.  A difference of  2-3 points is a clinically meaningful difference.  A difference of 2-3 points in the total score of the Quality of Life Index has been associated with significant improvement in overall quality of life, self-image, physical symptoms, and general health in studies assessing change in quality of life.  PHQ-9: Review Flowsheet        11/04/2021 10/21/2021 09/02/2021 07/07/2021  Depression screen PHQ 2/9  Decreased Interest 1 0 1 1  Down, Depressed, Hopeless 0 1 1 1   PHQ - 2 Score 1 1 2 2   Altered sleeping 1 2 1 2   Tired, decreased energy 1 0 1 1  Change in appetite 0 0 0 0  Feeling bad or failure about yourself  0 0 1 0  Trouble concentrating 0 0 1 0  Moving slowly or fidgety/restless 0 0 1 0  Suicidal thoughts 0 0 0 0  PHQ-9 Score 3 3 7 5   Difficult doing work/chores Not difficult at all Not difficult at all Somewhat difficult Not difficult at all         Interpretation of Total Score  Total Score Depression Severity:  1-4 = Minimal depression, 5-9 = Mild depression, 10-14 = Moderate depression, 15-19 = Moderately severe depression, 20-27 = Severe depression   Psychosocial Evaluation and Intervention:  Psychosocial Evaluation - 06/23/21 1000       Psychosocial Evaluation & Interventions   Interventions Encouraged to exercise with the program and follow exercise prescription    Comments Azaria has no barriers to attending the program. She is ready to get started and learn how to manage her heart disease. She lives alone and has 2 sons that are her support. One lived with her after her hospital discharge. He is back in [place]. Her other son also lives in [place]. 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 Her doctor has her on Metformin for Pre Diabetes;she does not check her blood sugars . She is fine with her blood sugar levels being checked here. She is ready to get  started and see what she can learn to keep healthy.    Expected Outcomes STG: Monic attends all scheduled sessions and education classes.  LTAG: Jackie Robinson is able to continue her progress from attending the program    Continue Psychosocial Services  Follow up required by staff             Psychosocial Re-Evaluation:  Psychosocial Re-Evaluation     [place] Name 07/30/21 1408 09/02/21 1419 10/21/21 1426         Psychosocial Re-Evaluation   Current issues with Current Stress Concerns;Current Sleep Concerns Current Stress Concerns Current Stress Concerns     Comments Jackie Robinson is doing well in rehab.  She has been getting frustrated with her chest pain that has picked up again over the last two weeks.  She has an appointment to see the cardiologist today.  Her fluid has been building up again and they have extended one of her medications.   She is going to take a copy of her reports with her today to her appointment.  She has not been sleeping well with all of this going on and was encouraged  to talk to her doctor about this as well. Kieli's PHQ has crept up some since dealing with her pericarditis.  But she tries not to let things get her down and she knows it could be a lot worse but she continues to want to get better.  Overall, she feels like she is doing well. Jackie Robinson is doing well mentally. 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 She thinks she will retire after they close which she is looking forward to. Fortunately, her PHQ decreased to 3! Her pericarditis has resolved for the most part. She denies other concerns at this time.     Expected Outcomes Short:Talk with doctor today Long: Conitnue to work on sleep Short: COntinue to cope with pericarditis Long: COnitnue to stay positive. Short: Continue attendance with rehab and retire? Long: Continue to utilize exercise for stress management      Interventions Stress management education;Encouraged to attend Cardiac Rehabilitation for the exercise Stress management education;Encouraged to attend Cardiac Rehabilitation for the exercise Encouraged to attend Cardiac Rehabilitation for the exercise     Continue Psychosocial Services  Follow up required by staff Follow up required by staff Follow up required by staff              Psychosocial Discharge (Final Psychosocial Re-Evaluation):  Psychosocial Re-Evaluation - 10/21/21 1426       Psychosocial Re-Evaluation   Current issues with Current Stress Concerns    Comments Jackie Robinson is doing well mentally. 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 She thinks she will retire after they close which she is looking forward to. Fortunately, her PHQ decreased to 3! Her pericarditis has resolved for the most part. She denies other concerns at this time.    Expected Outcomes Short: Continue attendance with rehab and retire? Long: Continue to utilize exercise for stress management    Interventions Encouraged to attend Cardiac Rehabilitation for the exercise    Continue Psychosocial Services  Follow up required by staff             Vocational Rehabilitation: Provide vocational rehab assistance to qualifying candidates.   Vocational Rehab Evaluation & Intervention:  Vocational Rehab - 06/23/21 0953       Initial Vocational Rehab Evaluation & Intervention   Assessment shows need for Vocational Rehabilitation No             Education: Education Goals: Education classes will be provided on a variety of topics geared toward better understanding of heart health and risk factor modification. Participant will state understanding/return demonstration of topics presented as noted by education test scores.  Learning Barriers/Preferences:  Learning Barriers/Preferences - 06/23/21 7425        Learning Barriers/Preferences   Learning Barriers None    Learning Preferences None             General Cardiac Education Topics:  AED/CPR: - Group verbal and written instruction with the use of models to demonstrate the basic use of the AED with the basic ABC's of resuscitation.   Anatomy and Cardiac Procedures: - Group verbal and visual presentation and models provide information about basic cardiac anatomy and function. Reviews the testing methods done to diagnose heart disease and the outcomes of the test results. Describes the treatment  choices: Medical Management, Angioplasty, or Coronary Bypass Surgery for treating various heart conditions including Myocardial Infarction, Angina, Valve Disease, and Cardiac Arrhythmias.  Written material given at graduation. Flowsheet Row Cardiac Rehab from 11/04/2021 in [facility] Cardiac and Pulmonary Rehab  Education need identified 07/07/21  Date 10/28/21  Educator SB  Instruction Review Code 1- Verbalizes Understanding       Medication Safety: - Group verbal and visual instruction to review commonly prescribed medications for heart and lung disease. Reviews the medication, class of the drug, and side effects. Includes the steps to properly store meds and maintain the prescription regimen.  Written material given at graduation. Flowsheet Row Cardiac Rehab from 11/04/2021 in [facility] Cardiac and Pulmonary Rehab  Date 09/16/21  Educator [facility]  Instruction Review Code 1- Verbalizes Understanding       Intimacy: - Group verbal instruction through game format to discuss how heart and lung disease can affect sexual intimacy. Written material given at graduation.. Flowsheet Row Cardiac Rehab from 11/04/2021 in [facility] Cardiac and Pulmonary Rehab  Date 10/21/21  Educator [place]  Instruction Review Code 1- Verbalizes Understanding       Know Your Numbers and Heart Failure: - Group verbal and visual instruction to discuss disease risk factors for cardiac and  pulmonary disease and treatment options.  Reviews associated critical values for Overweight/Obesity, Hypertension, Cholesterol, and Diabetes.  Discusses basics of heart failure: signs/symptoms and treatments.  Introduces Heart Failure Zone chart for action plan for heart failure.  Written material given at graduation. Flowsheet Row Cardiac Rehab from 11/04/2021 in [facility] Cardiac and Pulmonary Rehab  Education need identified 07/07/21       Infection Prevention: - Provides verbal and written material to individual with discussion of infection control including proper hand washing and proper equipment cleaning during exercise session. Flowsheet Row Cardiac Rehab from 11/04/2021 in [facility] Cardiac and Pulmonary Rehab  Education need identified 07/07/21  Date 07/07/21  Educator [place]  Instruction Review Code 1- Verbalizes Understanding       Falls Prevention: - Provides verbal and written material to individual with discussion of falls prevention and safety. Flowsheet Row Cardiac Rehab from 11/04/2021 in [facility] Cardiac and Pulmonary Rehab  Education need identified 07/07/21  Date 07/07/21  Educator [place]  Instruction Review Code 1- Verbalizes Understanding       Other: -Provides group and verbal instruction on various topics (see comments)   Knowledge Questionnaire Score:  Knowledge Questionnaire Score - 11/04/21 1349       Knowledge Questionnaire Score   Pre Score 23/26: MI, BP, Exercise    Post Score 26/26             Core Components/Risk Factors/Patient Goals at Admission:  Personal Goals and Risk Factors at Admission - 07/07/21 1415       Core Components/Risk Factors/Patient Goals on Admission    Weight Management Yes;Weight Loss    Intervention Weight Management: Develop a combined nutrition and exercise program designed to reach desired caloric intake, while maintaining appropriate intake of nutrient and fiber, sodium and fats, and appropriate energy expenditure required for  the weight goal.;Weight Management: Provide education and appropriate resources to help participant work on and attain dietary goals.;Weight Management/Obesity: Establish reasonable short term and long term weight goals.    Admit Weight 191 lb (86.6 kg)    Goal Weight: Short Term 186 lb (84.4 kg)    Goal Weight: Long Term 181 lb (82.1 kg)    Expected Outcomes Short Term: Continue to assess and modify  interventions until short term weight is achieved;Long Term: Adherence to nutrition and physical activity/exercise program aimed toward attainment of established weight goal;Weight Loss: Understanding of general recommendations for a balanced deficit meal plan, which promotes 1-2 lb weight loss per week and includes a negative energy balance of [phone] kcal/d;Understanding recommendations for meals to include 15-35% energy as protein, 25-35% energy from fat, 35-60% energy from carbohydrates, less than 258m of dietary cholesterol, 20-35 gm of total fiber daily;Understanding of distribution of calorie intake throughout the day with the consumption of 4-5 meals/snacks    Diabetes Yes    Intervention Provide education about signs/symptoms and action to take for hypo/hyperglycemia.;Provide education about proper nutrition, including hydration, and aerobic/resistive exercise prescription along with prescribed medications to achieve blood glucose in normal ranges: Fasting glucose 65-99 mg/dL    Expected Outcomes Short Term: Participant verbalizes understanding of the signs/symptoms and immediate care of hyper/hypoglycemia, proper foot care and importance of medication, aerobic/resistive exercise and nutrition plan for blood glucose control.;Long Term: Attainment of HbA1C < 7%.    Hypertension Yes    Intervention Provide education on lifestyle modifcations including regular physical activity/exercise, weight management, moderate sodium restriction and increased consumption of fresh fruit, vegetables, and low fat  dairy, alcohol moderation, and smoking cessation.;Monitor prescription use compliance.    Expected Outcomes Short Term: Continued assessment and intervention until BP is < 140/933mHG in hypertensive participants. < 130/8018mG in hypertensive participants with diabetes, heart failure or chronic kidney disease.;Long Term: Maintenance of blood pressure at goal levels.    Lipids Yes    Intervention Provide education and support for participant on nutrition & aerobic/resistive exercise along with prescribed medications to achieve LDL <42m64mDL >40mg67m Expected Outcomes Short Term: Participant states understanding of desired cholesterol values and is compliant with medications prescribed. Participant is following exercise prescription and nutrition guidelines.;Long Term: Cholesterol controlled with medications as prescribed, with individualized exercise RX and with personalized nutrition plan. Value goals: LDL < 42mg,31m > 40 mg.             Education:Diabetes - Individual verbal and written instruction to review signs/symptoms of diabetes, desired ranges of glucose level fasting, after meals and with exercise. Acknowledge that pre and post exercise glucose checks will be done for 3 sessions at entry of program. [facility] [place]  Education need identified 07/07/21  Date 07/07/21  Educator [place]  [place] Review Code 1- Verbalizes Understanding       Core Components/Risk Factors/Patient Goals Review:   Goals and Risk Factor Review     Row Name 07/30/21 1411 09/02/21 1425 10/21/21 1418         Core Components/Risk Factors/Patient Goals Review   Personal Goals Review Weight Management/Obesity;Hypertension;Diabetes Weight Management/Obesity;Hypertension;Diabetes Weight Management/Obesity;Hypertension;Diabetes     Review SharonEnyahing well in rehab.  Her weight is holding steady.  Her pressures are doing well in class, she is  not checking them at home but plans to start.  Her sugars are doing well.  She see her cardiologist today and plans to talk with him about her chest pain. SharonTashekaeen maintaining her weight.  Her pressures have been doing well.  She continues to do well with her sugars. She has been out with pericarditis but was cleared to return.  She is taking motrin to help manage pain and will follow up again next month. SharonLynniahs she does not check her sugars and hasn't  discussed it with her doctors in a while. Her last A1C was 6.3%. She believes she is getting new values checked next month. I encouraged her to talk with doctor on what her goals are for managing DM, checking sugars, where they should be, etc. She knows to monitor for any symptoms of highs or lows. She is in agreement. Her weight is stable and has not changed. She does not check her BP at home and does not have a cuff. BPs at rehab are stable. She is thinking about buying a cuff so she can check them at home. Importance was stressed on that as well.     Expected Outcomes Short: See cardilogist Long: Continue to montior risk factors short: Continue to manage pericarditis Long: Continue to monitor risk factors. Short: Talk to doctor about sugars, start checking Long: Continue to manage lifestyle risk factors              Core Components/Risk Factors/Patient Goals at Discharge (Final Review):   Goals and Risk Factor Review - 10/21/21 1418       Core Components/Risk Factors/Patient Goals Review   Personal Goals Review Weight Management/Obesity;Hypertension;Diabetes    Review Jackie Robinson states she does not check her sugars and hasn't discussed it with her doctors in a while. Her last A1C was 6.3%. She believes she is getting new values checked next month. I encouraged her to talk with doctor on what her goals are for managing DM, checking sugars, where they should be, etc. She knows to monitor for any symptoms of highs or lows. She is in agreement.  Her weight is stable and has not changed. She does not check her BP at home and does not have a cuff. BPs at rehab are stable. She is thinking about buying a cuff so she can check them at home. Importance was stressed on that as well.    Expected Outcomes Short: Talk to doctor about sugars, start checking Long: Continue to manage lifestyle risk factors             ITP Comments:  ITP Comments     Row Name 06/23/21 1006 07/07/21 0937 07/13/21 1343 07/15/21 0842 07/27/21 1534   ITP Comments Virtual orientation call completed today. shehas an appointment on Date: 07/07/2021  for EP eval and gym Orientation.  Documentation of diagnosis can be found in CL  Date: 05/05/21 and 05/22/2021 . Completed 6MWT and gym orientation. Initial ITP created and sent for review to Dr. Emily Filbert, Medical Director. First full day of exercise!  Patient was oriented to gym and equipment including functions, settings, policies, and procedures.  Patient's individual exercise prescription and treatment plan were reviewed.  All starting workloads were established based on the results of the 6 minute walk test done at initial orientation visit.  The plan for exercise progression was also introduced and progression will be customized based on patient's performance and goals. 30 Day review completed. Medical Director ITP review done, changes made as directed, and signed approval by Medical Director.   New to program Completed initial RD consultation    Row Name 08/05/21 1255 08/12/21 0823 08/13/21 1158 09/09/21 0823 10/06/21 0804   ITP Comments Patient still feeling pain from pericarditis and called out from rehab. Has an echo and doctor follow up appointment on  2/22- will follow up after that. 30 Day review completed. Medical Director ITP review done, changes made as directed, and signed approval by Medical Director. 3 visits this month Jackie Robinson called to inform  staff that her cardiologist wants her to stay out of cardiac rehab  until after her follow up on 3/9. She is still recovering from pericarditis. 30 Day review completed. Medical Director ITP review done, changes made as directed, and signed approval by Medical Director.   Has returned to program Jackie Robinson has missed sessions due to a foot injury and wearing a boot.    [place] Name 10/07/21 1314 10/08/21 1655 10/12/21 1616 11/04/21 0829     ITP Comments 30 Day review completed. Medical Director ITP review done, changes made as directed, and signed approval by Medical Director. Patient called to let us know she finished her testing for her foot and is still using a walking boot as needed. She states her doctor said she can use regular shoes for periods of time if she needs to. She is aware she cannot wear a walking boot in rehab. She'd like to resume cardiac rehab- let her know we will modify and work with limited machines  and exercises to accommodate. Patient will return 4/24 unless anything changes in the meantime. Henchy returned to rehab today. She was able to exercise in a normal shoe while in class. 30 Day review completed. Medical Director ITP review done, changes made as directed, and signed approval by Medical Director.             Comments: Discharge ITP

## 2021-11-05 NOTE — Progress Notes (Signed)
Daily Session Note  Patient Details  Name: Jackie Robinson MRN: [id number] Date of Birth: [birth year] Referring Provider:   Flowsheet Row Cardiac Rehab from 07/07/2021 in [facility] Cardiac and Pulmonary Rehab  Referring Provider Jackie Angelica MD       Encounter Date: 11/05/2021  Check In:  Session Check In - 11/05/21 1401       Check-In   Supervising physician immediately available to respond to emergencies See telemetry face sheet for immediately available ER MD    Location [facility]-Cardiac & Pulmonary Rehab    Staff Present Jackie Sam, MA, RCEP, CCRP, CCET;Jackie Robinson, [place];Jackie Rigger, RN, BSN    Virtual Visit No    Medication changes reported     No    Fall or balance concerns reported    No    Warm-up and Cool-down Performed on first and last piece of equipment    Resistance Training Performed Yes    VAD Patient? No    PAD/SET Patient? No      Pain Assessment   Currently in Pain? No/denies                Social History   Tobacco Use  Smoking Status Never  Smokeless Tobacco Never    Goals Met:  Proper associated with RPD/PD & O2 Sat Independence with exercise equipment Exercise tolerated well No report of concerns or symptoms today Strength training completed today  Goals Unmet:  Not Applicable  Comments:  Jackie Robinson graduated today from  rehab with 36 sessions completed.  Details of the patient's exercise prescription and what She needs to do in order to continue the prescription and progress were discussed with patient.  Patient was given a copy of prescription and goals.  Patient verbalized understanding.  Jackie Robinson plans to continue to exercise by walking at home once foot healed.   Dr. Emily Robinson is Medical Director for Jackie Robinson.  Dr. Ottie Robinson is Medical Director for [facility] Pulmonary Rehabilitation.

## 2021-11-10 ENCOUNTER — Ambulatory Visit
Admission: RE | Admit: 2021-11-10 | Discharge: 2021-11-10 | Disposition: A | Discharge status: Home / Self Care | Payer: Medicare Other | Source: Ambulatory Visit | Attending: Internal Medicine | Admitting: Internal Medicine

## 2021-11-10 DIAGNOSIS — Z1231 Encounter for screening mammogram for malignant neoplasm of breast: Secondary | ICD-10-CM | POA: Insufficient documentation

## 2021-11-13 ENCOUNTER — Other Ambulatory Visit: Payer: Self-pay | Admitting: Internal Medicine

## 2021-11-13 DIAGNOSIS — R928 Other abnormal and inconclusive findings on diagnostic imaging of breast: Secondary | ICD-10-CM

## 2021-11-13 DIAGNOSIS — N6489 Other specified disorders of breast: Secondary | ICD-10-CM

## 2021-12-08 ENCOUNTER — Ambulatory Visit
Admission: RE | Admit: 2021-12-08 | Discharge: 2021-12-08 | Disposition: A | Discharge status: Home / Self Care | Payer: Medicare Other | Source: Ambulatory Visit | Attending: Internal Medicine | Admitting: Internal Medicine

## 2021-12-08 DIAGNOSIS — R928 Other abnormal and inconclusive findings on diagnostic imaging of breast: Secondary | ICD-10-CM

## 2021-12-08 DIAGNOSIS — N6489 Other specified disorders of breast: Secondary | ICD-10-CM

## 2022-01-16 ENCOUNTER — Emergency Department: Payer: Medicare Other

## 2022-01-16 ENCOUNTER — Other Ambulatory Visit: Payer: Self-pay

## 2022-01-16 ENCOUNTER — Encounter: Payer: Self-pay | Admitting: Emergency Medicine

## 2022-01-16 ENCOUNTER — Emergency Department
Admission: EM | Admit: 2022-01-16 | Discharge: 2022-01-16 | Disposition: A | Discharge status: Home / Self Care | Payer: Medicare Other | Attending: Emergency Medicine | Admitting: Emergency Medicine

## 2022-01-16 DIAGNOSIS — W232XXA Caught, crushed, jammed or pinched between a moving and stationary object, initial encounter: Secondary | ICD-10-CM | POA: Diagnosis not present

## 2022-01-16 DIAGNOSIS — S93601A Unspecified sprain of right foot, initial encounter: Secondary | ICD-10-CM

## 2022-01-16 DIAGNOSIS — S99921A Unspecified injury of right foot, initial encounter: Secondary | ICD-10-CM | POA: Diagnosis present

## 2022-01-16 MED ORDER — HYDROCODONE-ACETAMINOPHEN 5-325 MG PO TABS: 1.0000 | ORAL_TABLET | ORAL | 0 refills | Status: AC | PRN

## 2022-01-16 MED ORDER — PREDNISONE 20 MG PO TABS
60.0000 mg | ORAL_TABLET | Freq: Once | ORAL | Status: AC
  Administered 2022-01-16: 60 mg via ORAL
  Filled 2022-01-16: qty 3

## 2022-01-16 MED ORDER — MELOXICAM 7.5 MG PO TABS: 7.5000 mg | ORAL_TABLET | Freq: Every day | ORAL | 0 refills | Status: AC

## 2022-01-16 MED ORDER — HYDROCODONE-ACETAMINOPHEN 5-325 MG PO TABS
1.0000 | ORAL_TABLET | Freq: Once | ORAL | Status: AC
  Administered 2022-01-16: 1 via ORAL
  Filled 2022-01-16: qty 1

## 2022-01-16 NOTE — ED Triage Notes (Signed)
Pt via POV from home. Pt c/o R foot pain and swelling that started. Last week that got worse today. States she may have twisted it when she moving furniture last week. More painful when she bare weight. Pt is A&Ox4 and NAD

## 2022-01-16 NOTE — ED Provider Notes (Signed)
[facility] Provider Note  Patient Contact: 8:51 PM (approximate)   History   Foot Pain   HPI  Jackie SHAIKH is a 66 y.o. female who presents the emergency department complaining of right lateral foot pain.  Patient is concerned that she may have a metatarsal fracture.  She states that she was rolling in an office chair when she caught her foot between the chair and a desk.  She has had pain along the lateral aspect since.  No erythema, no edema.  Patient has a history of a metatarsal fracture from a different injury to the left foot and pain feels similar to the patient presents for evaluation.     Physical Exam   Triage Vital Signs: ED Triage Vitals  Enc Vitals Group     BP 01/16/22 1857 (!) 156/98     Pulse Rate 01/16/22 1857 94     Resp 01/16/22 1857 18     Temp 01/16/22 1857 98.3 F (36.8 C)     Temp Source 01/16/22 1857 Oral     SpO2 01/16/22 1857 98 %     Weight 01/16/22 1845 198 lb (89.8 kg)     Height 01/16/22 1845 5\' 7"  (1.702 m)     Head Circumference --      Peak Flow --      Pain Score 01/16/22 1845 10     Pain Loc --      Pain Edu? --      Excl. in GC? --     Most recent vital signs: Vitals:   01/16/22 1857  BP: (!) 156/98  Pulse: 94  Resp: 18  Temp: 98.3 F (36.8 C)  SpO2: 98%     General: Alert and in no acute distress.  Cardiovascular:  Good peripheral perfusion Respiratory: Normal respiratory effort without tachypnea or retractions. Lungs CTAB.  Musculoskeletal: Full range of motion to all extremities.  Visualization of the right foot reveals no visible signs of trauma with ecchymosis, edema, lacerations or abrasions.  Patient is tender over the MTP joint of the fifth digit.  No other significant tenderness to palpation.  No palpable abnormality.  Pulse intact to the foot.  Sensation intact all digits. Neurologic:  No gross focal neurologic deficits are appreciated.  Skin:   No rash noted Other:   ED Results /  Procedures / Treatments   Labs (all labs ordered are listed, but only abnormal results are displayed) Labs Reviewed - No data to display   EKG     RADIOLOGY  I personally viewed, evaluated, and interpreted these images as part of my medical decision making, as well as reviewing the written report by the radiologist.  ED Provider Interpretation: No evidence of fracture or dislocation to the right foot.  DG Foot Complete Right  Result Date: 01/16/2022 CLINICAL DATA:  Right foot pain and swelling starting last week, worse today. Twisting injury while moving furniture last week. Pain on weight-bearing. EXAM: RIGHT FOOT COMPLETE - 3+ VIEW COMPARISON:  None Available. FINDINGS: Screw fixation of an old fracture of the distal tibia at the medial malleolus. No evidence of acute fracture or dislocation. No focal bone lesion or bone destruction. Joint spaces are normal. Soft tissues are unremarkable. IMPRESSION: No acute bony abnormalities. Electronically Signed   By: 01/18/2022 M.D.   On: 01/16/2022 19:21    PROCEDURES:  Critical Care performed: No  Procedures   MEDICATIONS ORDERED IN ED: Medications  HYDROcodone-acetaminophen (NORCO/VICODIN) 5-325 MG per  tablet 1 tablet (has no administration in time range)  predniSONE (DELTASONE) tablet 60 mg (has no administration in time range)     IMPRESSION / MDM / ASSESSMENT AND PLAN / ED COURSE  I reviewed the triage vital signs and the nursing notes.                              Differential diagnosis includes, but is not limited to, foot sprain, metatarsal fracture, gout, septic arthritis  Patient's presentation is most consistent with acute illness / injury with system symptoms.   Patient's diagnosis is consistent with foot sprain.  Patient presented to the ED with right foot pain.  This was beginning after trauma.  Patient with no traumatic findings on x-ray.  Exam was reassuring.  Patient states that she has vision problems and  cannot wear a boot.  As such I have recommended wearing shoes, low-dose anti-inflammatory and limited pain medication.  Follow-up with her podiatrist if symptoms persist..  Patient is given ED precautions to return to the ED for any worsening or new symptoms.        FINAL CLINICAL IMPRESSION(S) / ED DIAGNOSES   Final diagnoses:  Sprain of right foot, initial encounter     Rx / DC Orders   ED Discharge Orders          Ordered    HYDROcodone-acetaminophen (NORCO/VICODIN) 5-325 MG tablet  Every 4 hours PRN        01/16/22 2053    meloxicam (MOBIC) 7.5 MG tablet  Daily        01/16/22 2053             Note:  This document was prepared using Dragon voice recognition software and may include unintentional dictation errors.   Lanette Hampshire 01/16/22 2055    Concha Se, MD 01/17/22 [phone]

## 2022-01-26 ENCOUNTER — Ambulatory Visit: Payer: Medicare Other | Admitting: Podiatry

## 2022-01-28 ENCOUNTER — Ambulatory Visit (INDEPENDENT_AMBULATORY_CARE_PROVIDER_SITE_OTHER): Payer: Medicare Other

## 2022-01-28 ENCOUNTER — Ambulatory Visit (INDEPENDENT_AMBULATORY_CARE_PROVIDER_SITE_OTHER): Payer: Medicare Other | Admitting: Podiatry

## 2022-01-28 DIAGNOSIS — S99191A Other physeal fracture of right metatarsal, initial encounter for closed fracture: Secondary | ICD-10-CM | POA: Diagnosis not present

## 2022-01-28 MED ORDER — TRAMADOL HCL 50 MG PO TABS: 50.0000 mg | ORAL_TABLET | Freq: Three times a day (TID) | ORAL | 0 refills | Status: AC | PRN
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
## 2022-03-16 ENCOUNTER — Ambulatory Visit (INDEPENDENT_AMBULATORY_CARE_PROVIDER_SITE_OTHER): Payer: Medicare Other | Admitting: Podiatry

## 2022-03-16 DIAGNOSIS — S99191A Other physeal fracture of right metatarsal, initial encounter for closed fracture: Secondary | ICD-10-CM
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
## 2022-09-22 ENCOUNTER — Ambulatory Visit: Payer: Medicare Other | Admitting: Anesthesiology

## 2022-09-22 ENCOUNTER — Encounter: Payer: Self-pay | Admitting: Internal Medicine

## 2022-09-22 ENCOUNTER — Ambulatory Visit
Admission: RE | Admit: 2022-09-22 | Discharge: 2022-09-22 | Disposition: A | Discharge status: Home / Self Care | Payer: Medicare Other | Source: Ambulatory Visit | Attending: Internal Medicine | Admitting: Internal Medicine

## 2022-09-22 ENCOUNTER — Encounter
Admission: RE | Disposition: A | Discharge status: Home / Self Care | Payer: Self-pay | Source: Ambulatory Visit | Attending: Internal Medicine

## 2022-09-22 DIAGNOSIS — D509 Iron deficiency anemia, unspecified: Secondary | ICD-10-CM | POA: Diagnosis not present

## 2022-09-22 DIAGNOSIS — I2510 Atherosclerotic heart disease of native coronary artery without angina pectoris: Secondary | ICD-10-CM | POA: Insufficient documentation

## 2022-09-22 DIAGNOSIS — K219 Gastro-esophageal reflux disease without esophagitis: Secondary | ICD-10-CM | POA: Insufficient documentation

## 2022-09-22 DIAGNOSIS — E890 Postprocedural hypothyroidism: Secondary | ICD-10-CM | POA: Insufficient documentation

## 2022-09-22 DIAGNOSIS — K222 Esophageal obstruction: Secondary | ICD-10-CM | POA: Diagnosis present

## 2022-09-22 DIAGNOSIS — R1314 Dysphagia, pharyngoesophageal phase: Secondary | ICD-10-CM | POA: Insufficient documentation

## 2022-09-22 DIAGNOSIS — I10 Essential (primary) hypertension: Secondary | ICD-10-CM | POA: Diagnosis not present

## 2022-09-22 DIAGNOSIS — G4730 Sleep apnea, unspecified: Secondary | ICD-10-CM | POA: Diagnosis not present

## 2022-09-22 DIAGNOSIS — E785 Hyperlipidemia, unspecified: Secondary | ICD-10-CM | POA: Diagnosis not present

## 2022-09-22 DIAGNOSIS — I252 Old myocardial infarction: Secondary | ICD-10-CM | POA: Diagnosis not present

## 2022-09-22 DIAGNOSIS — K449 Diaphragmatic hernia without obstruction or gangrene: Secondary | ICD-10-CM | POA: Diagnosis not present

## 2022-09-22 DIAGNOSIS — E119 Type 2 diabetes mellitus without complications: Secondary | ICD-10-CM | POA: Diagnosis not present

## 2022-09-22 DIAGNOSIS — Z955 Presence of coronary angioplasty implant and graft: Secondary | ICD-10-CM | POA: Insufficient documentation

## 2022-09-22 HISTORY — PX: ESOPHAGOGASTRODUODENOSCOPY: SHX5428

## 2022-09-22 HISTORY — PX: COLONOSCOPY: SHX5424

## 2022-09-22 LAB — GLUCOSE, CAPILLARY: Glucose-Capillary: 118 mg/dL — ABNORMAL HIGH (ref 70–99)

## 2022-09-22 SURGERY — EGD (ESOPHAGOGASTRODUODENOSCOPY)
Anesthesia: General

## 2022-09-22 MED ORDER — PROPOFOL 10 MG/ML IV BOLUS
INTRAVENOUS | Status: DC | PRN
  Administered 2022-09-22: 70 mg via INTRAVENOUS
  Administered 2022-09-22: 20 mg via INTRAVENOUS

## 2022-09-22 MED ORDER — LIDOCAINE HCL (CARDIAC) PF 100 MG/5ML IV SOSY
PREFILLED_SYRINGE | INTRAVENOUS | Status: DC | PRN
  Administered 2022-09-22: 50 mg via INTRAVENOUS

## 2022-09-22 MED ORDER — PROPOFOL 10 MG/ML IV BOLUS
INTRAVENOUS | Status: AC
  Filled 2022-09-22: qty 40

## 2022-09-22 MED ORDER — PROPOFOL 500 MG/50ML IV EMUL
INTRAVENOUS | Status: DC | PRN
  Administered 2022-09-22: 75 ug/kg/min via INTRAVENOUS

## 2022-09-22 MED ORDER — GLYCOPYRROLATE 0.2 MG/ML IJ SOLN
INTRAMUSCULAR | Status: AC
  Filled 2022-09-22: qty 1

## 2022-09-22 MED ORDER — SODIUM CHLORIDE 0.9 % IV SOLN: INTRAVENOUS | Status: DC

## 2022-09-22 NOTE — Op Note (Signed)
[facility] Patient Name: Jackie Robinson Procedure Date: 09/22/2022 8:46 AM MRN: [id number] Account #: [id number] Date of Birth: 1956-04-29 Admit Type: Outpatient Age: 67 Room: [facility] ENDO ROOM 2 Gender: Female Note Status: Finalized Instrument Name: Altamese Cabal Endoscope [id number] Procedure:             Upper GI endoscopy Indications:           Esophageal dysphagia, Gastro-esophageal reflux                         disease, For therapy of esophageal stenosis Providers:             Benay Pike. Carliyah Cotterman MD, MD Medicines:             Propofol per Anesthesia Complications:         No immediate complications. Procedure:             Pre-Anesthesia Assessment:                        - The risks and benefits of the procedure and the                         sedation options and risks were discussed with the                         patient. All questions were answered and informed                         consent was obtained.                        - Patient identification and proposed procedure were                         verified prior to the procedure by the nurse. The                         procedure was verified in the procedure room.                        - ASA Grade Assessment: III - A patient with severe                         systemic disease.                        - After reviewing the risks and benefits, the patient                         was deemed in satisfactory condition to undergo the                         procedure.                        After obtaining informed consent, the endoscope was                         passed under direct vision. Throughout the procedure,  the patient's blood pressure, pulse, and oxygen                         saturations were monitored continuously. The Endoscope                         was introduced through the mouth, and advanced to the                         third part of duodenum. The  upper GI endoscopy was                         accomplished without difficulty. The patient tolerated                         the procedure well. Findings:      One benign-appearing, intrinsic mild stenosis was found at the       gastroesophageal junction. This stenosis measured 1.5 cm (inner       diameter) x less than one cm (in length). The stenosis was traversed.       The scope was withdrawn. Dilation was performed with a Maloney dilator       with mild resistance at 42 Fr.      The exam of the esophagus was otherwise normal.      A 3 cm hiatal hernia was present.      No other significant abnormalities were identified in a careful       examination of the stomach.      The examined duodenum was normal. Impression:            - Benign-appearing esophageal stenosis. Dilated.                        - 3 cm hiatal hernia.                        - Normal examined duodenum.                        - No specimens collected. Recommendation:        - Monitor results to esophageal dilation                        - Proceed with colonoscopy Procedure Code(s):     --- Professional ---                        [phone], Esophagogastroduodenoscopy, flexible,                         transoral; diagnostic, including collection of                         specimen(s) by brushing or washing, when performed                         (separate procedure)                        43450, Dilation of esophagus, by unguided sound or  bougie, single or multiple passes Diagnosis Code(s):     --- Professional ---                        K21.9, Gastro-esophageal reflux disease without                         esophagitis                        R13.14, Dysphagia, pharyngoesophageal phase                        K44.9, Diaphragmatic hernia without obstruction or                         gangrene                        K22.2, Esophageal obstruction CPT copyright 2022 American Medical Association. All  rights reserved. The codes documented in this report are preliminary and upon coder review may  be revised to meet current compliance requirements. Efrain Sella MD, MD 09/22/2022 9:07:29 AM This report has been signed electronically. Number of Addenda: 0 Note Initiated On: 09/22/2022 8:46 AM Estimated Blood Loss:  Estimated blood loss: none.      [facility]

## 2022-09-22 NOTE — Transfer of Care (Signed)
Immediate Anesthesia Transfer of Care Note  Patient: Jackie Robinson  Procedure(s) Performed: ESOPHAGOGASTRODUODENOSCOPY (EGD) COLONOSCOPY  Patient Location: Endoscopy Unit  Anesthesia Type:General  Level of Consciousness: patient cooperative and responds to stimulation  Airway & Oxygen Therapy: Patient Spontanous Breathing  Post-op Assessment: Report given to RN and Post -op Vital signs reviewed and stable  Post vital signs: Reviewed and stable  Last Vitals:  Vitals Value Taken Time  BP 116/71 09/22/22 0923  Temp 36.3 C 09/22/22 0922  Pulse 94 09/22/22 0924  Resp 20 09/22/22 0924  SpO2 99 % 09/22/22 0924  Vitals shown include unvalidated device data.  Last Pain:  Vitals:   09/22/22 0922  TempSrc: Temporal  PainSc: 0-No pain         Complications: No notable events documented.

## 2022-09-22 NOTE — Op Note (Signed)
[facility] Gastroenterology Patient Name: Jackie Robinson Procedure Date: 09/22/2022 8:45 AM MRN: [id number] Account #: [id number] Date of Birth: 02-01-1956 Admit Type: Outpatient Age: 67 Room: [facility] ENDO ROOM 2 Gender: Female Note Status: Finalized Instrument Name: Jasper Riling [id number] Procedure:             Colonoscopy Indications:           Unexplained iron deficiency anemia Providers:             Benay Pike. Allis Quirarte MD, MD Medicines:             Propofol per Anesthesia Complications:         No immediate complications. Procedure:             Pre-Anesthesia Assessment:                        - The risks and benefits of the procedure and the                         sedation options and risks were discussed with the                         patient. All questions were answered and informed                         consent was obtained.                        - Patient identification and proposed procedure were                         verified prior to the procedure by the nurse. The                         procedure was verified in the procedure room.                        - ASA Grade Assessment: III - A patient with severe                         systemic disease.                        - After reviewing the risks and benefits, the patient                         was deemed in satisfactory condition to undergo the                         procedure.                        After obtaining informed consent, the colonoscope was                         passed under direct vision. Throughout the procedure,                         the patient's blood pressure, pulse, and oxygen  saturations were monitored continuously. The                         Colonoscope was introduced through the anus and                         advanced to the the cecum, identified by appendiceal                         orifice and ileocecal valve. The colonoscopy was                          performed without difficulty. The patient tolerated                         the procedure well. The quality of the bowel                         preparation was good. The ileocecal valve, appendiceal                         orifice, and rectum were photographed. Findings:      The perianal and digital rectal examinations were normal. Pertinent       negatives include normal sphincter tone and no palpable rectal lesions.      Non-bleeding internal hemorrhoids were found during retroflexion. The       hemorrhoids were Grade II (internal hemorrhoids that prolapse but reduce       spontaneously).      No other significant abnormalities were identified in a careful       examination of the remainder of the colon. Impression:            - Non-bleeding internal hemorrhoids.                        - No specimens collected. Recommendation:        - Monitor results to esophageal dilation                        - Patient has a contact number available for                         emergencies. The signs and symptoms of potential                         delayed complications were discussed with the patient.                         Return to normal activities tomorrow. Written                         discharge instructions were provided to the patient.                        - Resume previous diet.                        - Continue present medications.                        -  To visualize the small bowel, perform video capsule                         endoscopy at appointment to be scheduled.                        - Return to nurse practitioner in 2 months.                        - Follow up with Jackie Bills, NP at [facility]                         Gastroenterology.                        - Telephone GI office to schedule appointment.                        - The findings and recommendations were discussed with                         the patient. Procedure Code(s):     ---  Professional ---                        [phone], Colonoscopy, flexible; diagnostic, including                         collection of specimen(s) by brushing or washing, when                         performed (separate procedure) Diagnosis Code(s):     --- Professional ---                        D50.9, Iron deficiency anemia, unspecified                        K64.1, Second degree hemorrhoids CPT copyright 2022 American Medical Association. All rights reserved. The codes documented in this report are preliminary and upon coder review may  be revised to meet current compliance requirements. Efrain Sella MD, MD 09/22/2022 9:22:50 AM This report has been signed electronically. Number of Addenda: 0 Note Initiated On: 09/22/2022 8:45 AM Scope Withdrawal Time: 0 hours 6 minutes 19 seconds  Total Procedure Duration: 0 hours 10 minutes 50 seconds  Estimated Blood Loss:  Estimated blood loss: none.      [facility]

## 2022-09-22 NOTE — Anesthesia Postprocedure Evaluation (Signed)
Anesthesia Post Note  Patient: Jackie Robinson  Procedure(s) Performed: ESOPHAGOGASTRODUODENOSCOPY (EGD) COLONOSCOPY  Patient location during evaluation: PACU Anesthesia Type: General Level of consciousness: awake and alert Pain management: pain level controlled Vital Signs Assessment: post-procedure vital signs reviewed and stable Respiratory status: spontaneous breathing, nonlabored ventilation, respiratory function stable and patient connected to nasal cannula oxygen Cardiovascular status: blood pressure returned to baseline and stable Postop Assessment: no apparent nausea or vomiting Anesthetic complications: no   No notable events documented.   Last Vitals:  Vitals:   09/22/22 0821 09/22/22 0922  BP: (!) 135/96 116/71  Pulse: 90 94  Resp: 16 15  Temp: 36.5 C (!) 36.3 C  SpO2: 99% 100%    Last Pain:  Vitals:   09/22/22 0944  TempSrc:   PainSc: 0-No pain                 Precious Haws Mate Alegria
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
## 2022-09-22 NOTE — Interval H&P Note (Signed)
History and Physical Interval Note:  09/22/2022 8:46 AM  Jackie Robinson  has presented today for surgery, with the diagnosis of Dysphagia, unspecified type (R13.10) Gastroesophageal reflux disease with esophagitis without hemorrhage (K21.00) IDA.  The various methods of treatment have been discussed with the patient and family. After consideration of risks, benefits and other options for treatment, the patient has consented to  Procedure(s): ESOPHAGOGASTRODUODENOSCOPY (EGD) (N/A) COLONOSCOPY (N/A) as a surgical intervention.  The patient's history has been reviewed, patient examined, no change in status, stable for surgery.  I have reviewed the patient's chart and labs.  Questions were answered to the patient's satisfaction.     [place], [place]

## 2022-09-23 ENCOUNTER — Other Ambulatory Visit: Payer: Self-pay | Admitting: Nurse Practitioner

## 2022-09-23 ENCOUNTER — Encounter: Payer: Self-pay | Admitting: Internal Medicine

## 2022-09-23 ENCOUNTER — Ambulatory Visit
Admission: RE | Admit: 2022-09-23 | Discharge: 2022-09-23 | Disposition: A | Discharge status: Home / Self Care | Payer: Medicare Other | Source: Ambulatory Visit | Attending: Nurse Practitioner | Admitting: Nurse Practitioner

## 2022-09-23 DIAGNOSIS — R1310 Dysphagia, unspecified: Secondary | ICD-10-CM | POA: Diagnosis present

## 2022-09-23 DIAGNOSIS — R070 Pain in throat: Secondary | ICD-10-CM

## 2022-09-23 MED ORDER — DIATRIZOATE MEGLUMINE & SODIUM 66-10 % PO SOLN
120.0000 mL | Freq: Once | ORAL | Status: AC
  Administered 2022-09-23: 120 mL via ORAL

## 2022-09-23 NOTE — Progress Notes (Signed)
Call to follow-up with the patient following her procedures, EGD & Colonoscopy with Dr Alice Reichert 09-22-22.  The patient states that she started experiencing 'severe' pain with swallowing food, medications, and liquids about 15 minutes after her discharge.  She has not had any fever, cough, but experiences the pain with each swallow.  I have asked the patient to notify the office this morning for further instructions.  She does have the phone number and she is in agreement with this plan.

## 2022-10-13 ENCOUNTER — Ambulatory Visit: Payer: BLUE CROSS/BLUE SHIELD | Admitting: Dermatology

## 2022-10-26 ENCOUNTER — Encounter: Payer: Self-pay | Admitting: Internal Medicine

## 2023-02-16 ENCOUNTER — Other Ambulatory Visit: Payer: Self-pay | Admitting: Internal Medicine

## 2023-02-16 DIAGNOSIS — Z1231 Encounter for screening mammogram for malignant neoplasm of breast: Secondary | ICD-10-CM

## 2023-04-06 ENCOUNTER — Ambulatory Visit
Admission: RE | Admit: 2023-04-06 | Discharge: 2023-04-06 | Disposition: A | Discharge status: Home / Self Care | Payer: Medicare Other | Source: Ambulatory Visit | Attending: Internal Medicine | Admitting: Internal Medicine

## 2023-04-06 DIAGNOSIS — Z1231 Encounter for screening mammogram for malignant neoplasm of breast: Secondary | ICD-10-CM | POA: Diagnosis present

## 2023-06-02 ENCOUNTER — Ambulatory Visit: Payer: Medicare Other | Admitting: Urology

## 2023-06-02 ENCOUNTER — Encounter: Payer: Self-pay | Admitting: Urology

## 2023-06-02 VITALS — BP 138/91 | HR 94 | Ht 66.0 in | Wt 195.0 lb

## 2023-06-02 DIAGNOSIS — Z87440 Personal history of urinary (tract) infections: Secondary | ICD-10-CM

## 2023-06-02 DIAGNOSIS — R3129 Other microscopic hematuria: Secondary | ICD-10-CM

## 2023-06-02 DIAGNOSIS — N390 Urinary tract infection, site not specified: Secondary | ICD-10-CM

## 2023-06-02 DIAGNOSIS — R8271 Bacteriuria: Secondary | ICD-10-CM | POA: Diagnosis not present

## 2023-06-02 LAB — URINALYSIS, COMPLETE
Bilirubin, UA: NEGATIVE
Glucose, UA: NEGATIVE
Ketones, UA: NEGATIVE
Leukocytes,UA: NEGATIVE
Nitrite, UA: NEGATIVE
Specific Gravity, UA: 1.025 (ref 1.005–1.030)
Urobilinogen, Ur: 0.2 mg/dL (ref 0.2–1.0)
pH, UA: 6.5 (ref 5.0–7.5)

## 2023-06-02 LAB — MICROSCOPIC EXAMINATION

## 2023-06-02 NOTE — Patient Instructions (Signed)
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
## 2023-06-06 ENCOUNTER — Encounter: Payer: Self-pay | Admitting: Urology

## 2023-06-14 ENCOUNTER — Ambulatory Visit
Admission: RE | Admit: 2023-06-14 | Discharge: 2023-06-14 | Disposition: A | Discharge status: Home / Self Care | Payer: Medicare Other | Source: Ambulatory Visit | Attending: Urology | Admitting: Urology

## 2023-06-14 DIAGNOSIS — R3129 Other microscopic hematuria: Secondary | ICD-10-CM

## 2023-06-16 ENCOUNTER — Telehealth: Payer: Self-pay | Admitting: Urology

## 2023-06-16 NOTE — Telephone Encounter (Signed)
Will go ahead and do cystoscopy on 1/9 and order either a Noncon CT or contrast CT with allergy prep depending on what cystoscopy results show

## 2023-06-16 NOTE — Telephone Encounter (Signed)
Pt went for CT that was scheduled yesterday and is allergic to contrast dye.  They had to cancel CT scan.  She has upcoming appt with you on 1/9 for CT results.

## 2023-06-17 NOTE — Telephone Encounter (Signed)
Pt aware.

## 2023-06-18 ENCOUNTER — Emergency Department: Payer: Medicare Other

## 2023-06-18 ENCOUNTER — Other Ambulatory Visit: Payer: Self-pay

## 2023-06-18 DIAGNOSIS — Z79890 Hormone replacement therapy (postmenopausal): Secondary | ICD-10-CM

## 2023-06-18 DIAGNOSIS — I471 Supraventricular tachycardia, unspecified: Secondary | ICD-10-CM | POA: Diagnosis present

## 2023-06-18 DIAGNOSIS — Z91041 Radiographic dye allergy status: Secondary | ICD-10-CM

## 2023-06-18 DIAGNOSIS — Z7984 Long term (current) use of oral hypoglycemic drugs: Secondary | ICD-10-CM

## 2023-06-18 DIAGNOSIS — Z7982 Long term (current) use of aspirin: Secondary | ICD-10-CM

## 2023-06-18 DIAGNOSIS — I2510 Atherosclerotic heart disease of native coronary artery without angina pectoris: Secondary | ICD-10-CM | POA: Diagnosis present

## 2023-06-18 DIAGNOSIS — Z8639 Personal history of other endocrine, nutritional and metabolic disease: Secondary | ICD-10-CM

## 2023-06-18 DIAGNOSIS — Z955 Presence of coronary angioplasty implant and graft: Secondary | ICD-10-CM

## 2023-06-18 DIAGNOSIS — I10 Essential (primary) hypertension: Secondary | ICD-10-CM | POA: Diagnosis present

## 2023-06-18 DIAGNOSIS — A4159 Other Gram-negative sepsis: Principal | ICD-10-CM | POA: Diagnosis present

## 2023-06-18 DIAGNOSIS — B961 Klebsiella pneumoniae [K. pneumoniae] as the cause of diseases classified elsewhere: Secondary | ICD-10-CM | POA: Diagnosis present

## 2023-06-18 DIAGNOSIS — E119 Type 2 diabetes mellitus without complications: Secondary | ICD-10-CM | POA: Diagnosis present

## 2023-06-18 DIAGNOSIS — E785 Hyperlipidemia, unspecified: Secondary | ICD-10-CM | POA: Diagnosis present

## 2023-06-18 DIAGNOSIS — Z79899 Other long term (current) drug therapy: Secondary | ICD-10-CM

## 2023-06-18 DIAGNOSIS — N390 Urinary tract infection, site not specified: Secondary | ICD-10-CM | POA: Diagnosis not present

## 2023-06-18 DIAGNOSIS — Z888 Allergy status to other drugs, medicaments and biological substances status: Secondary | ICD-10-CM

## 2023-06-18 DIAGNOSIS — E039 Hypothyroidism, unspecified: Secondary | ICD-10-CM | POA: Diagnosis present

## 2023-06-18 DIAGNOSIS — Z880 Allergy status to penicillin: Secondary | ICD-10-CM

## 2023-06-18 DIAGNOSIS — E669 Obesity, unspecified: Secondary | ICD-10-CM | POA: Diagnosis present

## 2023-06-18 DIAGNOSIS — Z9081 Acquired absence of spleen: Secondary | ICD-10-CM

## 2023-06-18 DIAGNOSIS — Z6831 Body mass index (BMI) 31.0-31.9, adult: Secondary | ICD-10-CM

## 2023-06-18 DIAGNOSIS — G4730 Sleep apnea, unspecified: Secondary | ICD-10-CM | POA: Diagnosis present

## 2023-06-18 LAB — CBC WITH DIFFERENTIAL/PLATELET

## 2023-06-18 NOTE — ED Provider Triage Note (Signed)
Emergency Medicine Provider Triage Evaluation Note  Jackie Robinson , a 67 y.o. female  was evaluated in triage.  Pt complains of fevers at home, urinary frequency, emesis, malaise, disorientation and inability to walk.  History of frequent UTIs..  Review of Systems  Positive:  Negative:   Physical Exam  BP 104/80 (BP Location: Left Arm)   Pulse (!) 116   Temp 98.9 F (37.2 C) (Oral)   Resp 18   Ht 5\' 6"  (1.676 m)   Wt 90.7 kg   SpO2 94%   BMI 32.28 kg/m  Gen:   Awake, no distress   Resp:  Normal effort  MSK:   Moves extremities without difficulty  Other:    Medical Decision Making  Medically screening exam initiated at 11:42 PM.  Appropriate orders placed.  Jackie Robinson was informed that the remainder of the evaluation will be completed by another provider, this initial triage assessment does not replace that evaluation, and the importance of remaining in the ED until their evaluation is complete.  Concerning for sepsis UTI. Will send sepsis screening    Delton Prairie, MD 06/18/23 [phone]

## 2023-06-18 NOTE — ED Triage Notes (Signed)
Pt to ED via POV c/o fever, vomiting, lower back pain, and weakness. Pt reports this started last week. Pt was seen recently for UTI and was given antibiotics. Denies CP, SOB, dizziness.

## 2023-06-19 ENCOUNTER — Emergency Department: Payer: Medicare Other

## 2023-06-19 ENCOUNTER — Inpatient Hospital Stay
Admission: EM | Admit: 2023-06-19 | Discharge: 2023-06-21 | DRG: 872 | Disposition: A | Discharge status: Home / Self Care | Payer: Medicare Other | Attending: Internal Medicine | Admitting: Internal Medicine

## 2023-06-19 DIAGNOSIS — I471 Supraventricular tachycardia, unspecified: Secondary | ICD-10-CM | POA: Diagnosis present

## 2023-06-19 DIAGNOSIS — E119 Type 2 diabetes mellitus without complications: Secondary | ICD-10-CM | POA: Diagnosis present

## 2023-06-19 DIAGNOSIS — I2510 Atherosclerotic heart disease of native coronary artery without angina pectoris: Secondary | ICD-10-CM | POA: Diagnosis present

## 2023-06-19 DIAGNOSIS — Z79899 Other long term (current) drug therapy: Secondary | ICD-10-CM | POA: Diagnosis not present

## 2023-06-19 DIAGNOSIS — Z888 Allergy status to other drugs, medicaments and biological substances status: Secondary | ICD-10-CM | POA: Diagnosis not present

## 2023-06-19 DIAGNOSIS — E1169 Type 2 diabetes mellitus with other specified complication: Secondary | ICD-10-CM | POA: Diagnosis not present

## 2023-06-19 DIAGNOSIS — E785 Hyperlipidemia, unspecified: Secondary | ICD-10-CM | POA: Diagnosis present

## 2023-06-19 DIAGNOSIS — Z7982 Long term (current) use of aspirin: Secondary | ICD-10-CM | POA: Diagnosis not present

## 2023-06-19 DIAGNOSIS — Z91041 Radiographic dye allergy status: Secondary | ICD-10-CM | POA: Diagnosis not present

## 2023-06-19 DIAGNOSIS — Z9081 Acquired absence of spleen: Secondary | ICD-10-CM | POA: Diagnosis not present

## 2023-06-19 DIAGNOSIS — B961 Klebsiella pneumoniae [K. pneumoniae] as the cause of diseases classified elsewhere: Secondary | ICD-10-CM | POA: Diagnosis present

## 2023-06-19 DIAGNOSIS — I10 Essential (primary) hypertension: Secondary | ICD-10-CM | POA: Diagnosis present

## 2023-06-19 DIAGNOSIS — A4159 Other Gram-negative sepsis: Secondary | ICD-10-CM | POA: Diagnosis present

## 2023-06-19 DIAGNOSIS — Z6831 Body mass index (BMI) 31.0-31.9, adult: Secondary | ICD-10-CM | POA: Diagnosis not present

## 2023-06-19 DIAGNOSIS — Z955 Presence of coronary angioplasty implant and graft: Secondary | ICD-10-CM | POA: Diagnosis not present

## 2023-06-19 DIAGNOSIS — G4730 Sleep apnea, unspecified: Secondary | ICD-10-CM | POA: Diagnosis present

## 2023-06-19 DIAGNOSIS — E669 Obesity, unspecified: Secondary | ICD-10-CM | POA: Diagnosis present

## 2023-06-19 DIAGNOSIS — R7881 Bacteremia: Secondary | ICD-10-CM | POA: Diagnosis not present

## 2023-06-19 DIAGNOSIS — Z880 Allergy status to penicillin: Secondary | ICD-10-CM | POA: Diagnosis not present

## 2023-06-19 DIAGNOSIS — A419 Sepsis, unspecified organism: Secondary | ICD-10-CM

## 2023-06-19 DIAGNOSIS — N390 Urinary tract infection, site not specified: Secondary | ICD-10-CM | POA: Diagnosis present

## 2023-06-19 DIAGNOSIS — Z7984 Long term (current) use of oral hypoglycemic drugs: Secondary | ICD-10-CM | POA: Diagnosis not present

## 2023-06-19 DIAGNOSIS — E039 Hypothyroidism, unspecified: Secondary | ICD-10-CM | POA: Diagnosis present

## 2023-06-19 DIAGNOSIS — Z79890 Hormone replacement therapy (postmenopausal): Secondary | ICD-10-CM | POA: Diagnosis not present

## 2023-06-19 DIAGNOSIS — Z8639 Personal history of other endocrine, nutritional and metabolic disease: Secondary | ICD-10-CM | POA: Diagnosis not present

## 2023-06-19 LAB — BLOOD CULTURE ID PANEL (REFLEXED) - BCID2

## 2023-06-19 LAB — CBC WITH DIFFERENTIAL/PLATELET
Basophils Relative: 0 10*3/uL (ref 0.0–0.1)
Eosinophils Absolute: 0 10*3/uL (ref 0.0–0.5)
Eosinophils Relative: 0 10*3/uL (ref 0.0–0.5)
HCT: 42.3 % (ref 36.0–46.0)
Hemoglobin: 13.6 g/dL (ref 12.0–15.0)
Lymphocytes Relative: 11 %
Lymphs Abs: 1.8 10*3/uL (ref 0.7–4.0)
MCH: 26.5 pg (ref 26.0–34.0)
MCHC: 32.2 g/dL (ref 30.0–36.0)
MCV: 82.5 fL (ref 80.0–100.0)
Monocytes Absolute: 0 10*3/uL (ref 0.1–1.0)
Monocytes Relative: 1.9 10*3/uL — ABNORMAL HIGH (ref 0.1–1.0)
Monocytes Relative: 12 10*3/uL (ref 0.7–4.0)
Neutro Abs: 12.6 10*3/uL — ABNORMAL HIGH (ref 1.7–7.7)
Neutrophils Relative %: 77 %
Other: 0.07 10*3/uL (ref 0.00–0.07)
Platelets: 337 10*3/uL (ref 150–400)
RBC: 5.13 MIL/uL — ABNORMAL HIGH (ref 3.87–5.11)
RDW: 17.2 % — ABNORMAL HIGH (ref 11.5–15.5)
Smear Review: 0 %
WBC: 16.4 10*3/uL — ABNORMAL HIGH (ref 4.0–10.5)
nRBC: 0 % (ref 0.0–0.2)

## 2023-06-19 LAB — CBG MONITORING, ED
Glucose-Capillary: 114 mg/dL — ABNORMAL HIGH (ref 70–99)
Glucose-Capillary: 165 mg/dL — ABNORMAL HIGH (ref 70–99)
Glucose-Capillary: 144 mg/dL — ABNORMAL HIGH (ref 70–99)

## 2023-06-19 LAB — PROCALCITONIN: Procalcitonin: 4.04 ng/mL

## 2023-06-19 LAB — URINALYSIS, ROUTINE W REFLEX MICROSCOPIC
Bilirubin Urine: NEGATIVE
Glucose, UA: NEGATIVE mg/dL
Ketones, ur: 5 mg/dL — AB
Nitrite: NEGATIVE
Protein, ur: >=300 mg/dL — AB
Specific Gravity, Urine: 1.027 (ref 1.005–1.030)
Squamous Epithelial / HPF: 0 /[HPF] (ref 0–5)
WBC, UA: >50 WBC/hpf (ref 0–5)
pH: 5 (ref 5.0–8.0)

## 2023-06-19 LAB — COMPREHENSIVE METABOLIC PANEL
ALT: 28 U/L (ref 0–44)
AST: 30 U/L (ref 15–41)
Albumin: 4 g/dL (ref 3.5–5.0)
Alkaline Phosphatase: 116 U/L (ref 38–126)
Anion gap: 14 (ref 5–15)
BUN: 26 mg/dL — ABNORMAL HIGH (ref 8–23)
CO2: 20 mmol/L — ABNORMAL LOW (ref 22–32)
Calcium: 8.6 mg/dL — ABNORMAL LOW (ref 8.9–10.3)
Chloride: 98 mmol/L (ref 98–111)
Creatinine, Ser: 1.1 mg/dL — ABNORMAL HIGH (ref 0.44–1.00)
GFR, Estimated: 55 mL/min — ABNORMAL LOW (ref >60)
Glucose, Bld: 172 mg/dL — ABNORMAL HIGH (ref 70–99)
Potassium: 3.5 mmol/L (ref 3.5–5.1)
Sodium: 132 mmol/L — ABNORMAL LOW (ref 135–145)
Total Bilirubin: 0.7 mg/dL (ref <1.2)
Total Protein: 8 g/dL (ref 6.5–8.1)

## 2023-06-19 LAB — LACTIC ACID, PLASMA
Lactic Acid, Venous: 1.2 mmol/L (ref 0.5–1.9)
Lactic Acid, Venous: 1.6 mmol/L (ref 0.5–1.9)

## 2023-06-19 LAB — PROTIME-INR
INR: 1.1 (ref 0.8–1.2)
Prothrombin Time: 14.7 s (ref 11.4–15.2)

## 2023-06-19 LAB — APTT: aPTT: 28 s (ref 24–36)

## 2023-06-19 LAB — LIPASE, BLOOD: Lipase: 27 U/L (ref 11–51)

## 2023-06-19 MED ORDER — ONDANSETRON HCL 4 MG/2ML IJ SOLN: 4.0000 mg | Freq: Four times a day (QID) | INTRAMUSCULAR | Status: DC | PRN

## 2023-06-19 MED ORDER — METRONIDAZOLE 500 MG/100ML IV SOLN
500.0000 mg | Freq: Once | INTRAVENOUS | Status: AC
  Administered 2023-06-19: 500 mg via INTRAVENOUS
  Filled 2023-06-19: qty 100

## 2023-06-19 MED ORDER — SODIUM CHLORIDE 0.9 % IV SOLN
2.0000 g | Freq: Once | INTRAVENOUS | Status: AC
  Administered 2023-06-19: 2 g via INTRAVENOUS
  Filled 2023-06-19: qty 10

## 2023-06-19 MED ORDER — ENOXAPARIN SODIUM 40 MG/0.4ML IJ SOSY: 40.0000 mg | PREFILLED_SYRINGE | INTRAMUSCULAR | Status: DC

## 2023-06-19 MED ORDER — PANTOPRAZOLE SODIUM 40 MG PO TBEC
40.0000 mg | DELAYED_RELEASE_TABLET | Freq: Every morning | ORAL | Status: DC
Start: 2023-06-19 — End: 2023-06-21
  Administered 2023-06-19 – 2023-06-21 (×3): 40 mg via ORAL
  Filled 2023-06-19 (×3): qty 1

## 2023-06-19 MED ORDER — INSULIN ASPART 100 UNIT/ML IJ SOLN
3.0000 [IU] | Freq: Three times a day (TID) | INTRAMUSCULAR | Status: DC
  Administered 2023-06-19 – 2023-06-21 (×3): 3 [IU] via SUBCUTANEOUS
  Filled 2023-06-19 (×3): qty 1

## 2023-06-19 MED ORDER — ACETAMINOPHEN 500 MG PO TABS
1000.0000 mg | ORAL_TABLET | Freq: Once | ORAL | Status: AC
  Administered 2023-06-19: 1000 mg via ORAL
  Filled 2023-06-19: qty 2

## 2023-06-19 MED ORDER — VANCOMYCIN HCL IN DEXTROSE 1-5 GM/200ML-% IV SOLN
1000.0000 mg | Freq: Once | INTRAVENOUS | Status: AC
  Administered 2023-06-19: 1000 mg via INTRAVENOUS
  Filled 2023-06-19: qty 200

## 2023-06-19 MED ORDER — ENOXAPARIN SODIUM 60 MG/0.6ML IJ SOSY
0.5000 mg/kg | PREFILLED_SYRINGE | INTRAMUSCULAR | Status: DC
  Administered 2023-06-19 – 2023-06-20 (×2): 45 mg via SUBCUTANEOUS
  Filled 2023-06-19: qty 0.6

## 2023-06-19 MED ORDER — DIPHENHYDRAMINE HCL 50 MG/ML IJ SOLN: 50.0000 mg | Freq: Once | INTRAMUSCULAR | Status: AC

## 2023-06-19 MED ORDER — EZETIMIBE 10 MG PO TABS
10.0000 mg | ORAL_TABLET | Freq: Every morning | ORAL | Status: DC
Start: 2023-06-19 — End: 2023-06-21
  Administered 2023-06-19 – 2023-06-21 (×3): 10 mg via ORAL
  Filled 2023-06-19 (×3): qty 1

## 2023-06-19 MED ORDER — LACTATED RINGERS IV BOLUS
1000.0000 mL | Freq: Once | INTRAVENOUS | Status: AC
  Administered 2023-06-19: 1000 mL via INTRAVENOUS

## 2023-06-19 MED ORDER — LEVOTHYROXINE SODIUM 88 MCG PO TABS
88.0000 ug | ORAL_TABLET | Freq: Every day | ORAL | Status: DC
Start: 2023-06-20 — End: 2023-06-19

## 2023-06-19 MED ORDER — ASPIRIN 81 MG PO TBEC
81.0000 mg | DELAYED_RELEASE_TABLET | Freq: Every day | ORAL | Status: DC
  Administered 2023-06-19 – 2023-06-21 (×3): 81 mg via ORAL
  Filled 2023-06-19 (×3): qty 1

## 2023-06-19 MED ORDER — ACETAMINOPHEN 325 MG PO TABS
650.0000 mg | ORAL_TABLET | Freq: Four times a day (QID) | ORAL | Status: DC | PRN
  Administered 2023-06-19 – 2023-06-21 (×3): 650 mg via ORAL
  Filled 2023-06-19 (×3): qty 2

## 2023-06-19 MED ORDER — METOPROLOL SUCCINATE ER 25 MG PO TB24
25.0000 mg | ORAL_TABLET | Freq: Every day | ORAL | Status: DC
  Administered 2023-06-19 – 2023-06-20 (×2): 25 mg via ORAL
  Filled 2023-06-19 (×2): qty 1

## 2023-06-19 MED ORDER — ROSUVASTATIN CALCIUM 10 MG PO TABS
5.0000 mg | ORAL_TABLET | Freq: Every day | ORAL | Status: DC
  Administered 2023-06-20 – 2023-06-21 (×2): 5 mg via ORAL
  Filled 2023-06-19 (×3): qty 1

## 2023-06-19 MED ORDER — ONDANSETRON HCL 4 MG PO TABS: 4.0000 mg | ORAL_TABLET | Freq: Four times a day (QID) | ORAL | Status: DC | PRN

## 2023-06-19 MED ORDER — DIPHENHYDRAMINE HCL 25 MG PO CAPS
50.0000 mg | ORAL_CAPSULE | Freq: Once | ORAL | Status: AC
  Administered 2023-06-19: 50 mg via ORAL
  Filled 2023-06-19: qty 2

## 2023-06-19 MED ORDER — INSULIN ASPART 100 UNIT/ML IJ SOLN
0.0000 [IU] | Freq: Three times a day (TID) | INTRAMUSCULAR | Status: DC
  Administered 2023-06-19: 2 [IU] via SUBCUTANEOUS
  Administered 2023-06-20: 1 [IU] via SUBCUTANEOUS
  Filled 2023-06-19 (×2): qty 1

## 2023-06-19 MED ORDER — ONDANSETRON HCL 4 MG/2ML IJ SOLN
4.0000 mg | Freq: Once | INTRAMUSCULAR | Status: AC
Start: 2023-06-19 — End: 2023-06-19
  Administered 2023-06-19: 4 mg via INTRAVENOUS
  Filled 2023-06-19: qty 2

## 2023-06-19 MED ORDER — LEVOTHYROXINE SODIUM 88 MCG PO TABS
88.0000 ug | ORAL_TABLET | Freq: Every day | ORAL | Status: DC
  Administered 2023-06-20 – 2023-06-21 (×2): 88 ug via ORAL
  Filled 2023-06-19 (×3): qty 1

## 2023-06-19 MED ORDER — SALINE SPRAY 0.65 % NA SOLN
1.0000 | Freq: Once | NASAL | Status: DC
  Filled 2023-06-19: qty 44

## 2023-06-19 MED ORDER — LACTATED RINGERS IV SOLN: INTRAVENOUS | Status: DC

## 2023-06-19 MED ORDER — SODIUM CHLORIDE 0.9 % IV SOLN
2.0000 g | INTRAVENOUS | Status: DC
  Administered 2023-06-19 – 2023-06-20 (×2): 2 g via INTRAVENOUS
  Filled 2023-06-19 (×3): qty 20

## 2023-06-19 MED ORDER — PREDNISONE 20 MG PO TABS
50.0000 mg | ORAL_TABLET | Freq: Four times a day (QID) | ORAL | Status: AC
  Administered 2023-06-19 (×3): 50 mg via ORAL
  Filled 2023-06-19: qty 3
  Filled 2023-06-19 (×2): qty 1

## 2023-06-19 NOTE — Assessment & Plan Note (Addendum)
EKG sinus tach with heart rate in the 120s in the setting of sepsis associated with UTI Continue metoprolol IV fluid hydration Monitor

## 2023-06-19 NOTE — Assessment & Plan Note (Addendum)
Fever, back pain nausea vomiting and dysuria x 1 week Failed outpatient course of antibiotics Urinalysis indicative of infection IV Rocephin Repeat urine culture Pain control Antiemetics IV fluids Monitor Case also preliminarily discussed with Dr. Lonna Cobb given prior evaluation for similar symptoms in office. Will plan for CT hematuria study to better assess  [facility] consult urology with Dr. Stop as appropriate pending imaging
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
## 2023-06-19 NOTE — Assessment & Plan Note (Signed)
BP stable Titrate home regimen 

## 2023-06-19 NOTE — Assessment & Plan Note (Signed)
Noted baseline CAD w/ prior DES CTO LAD 05/22/2021 complicated by perforation and pericardial tamponade  Followed by Dr. Darrold Junker outpatient No active chest pain Continue home regimen including aspirin for now Monitor

## 2023-06-19 NOTE — Progress Notes (Signed)
CODE SEPSIS - PHARMACY COMMUNICATION  **Broad Spectrum Antibiotics should be administered within 1 hour of Sepsis diagnosis**  Time Code Sepsis Called/Page Received: 4098  Antibiotics Ordered: Aztreonam, Flagyl, Vancomycin  Time of 1st antibiotic administration: 0459  Otelia Sergeant, PharmD, [facility] 06/19/2023 4:51 AM

## 2023-06-19 NOTE — ED Notes (Signed)
Pt used bedpan 

## 2023-06-19 NOTE — ED Notes (Signed)
Patient placed on stretcher in triage in effort to promote comfort and for medication administration.

## 2023-06-19 NOTE — Progress Notes (Signed)
PHARMACIST - PHYSICIAN COMMUNICATION  CONCERNING:  Enoxaparin (Lovenox) for DVT Prophylaxis    RECOMMENDATION: Patient was prescribed enoxaprin 40mg  q24 hours for VTE prophylaxis.   Filed Weights   06/18/23 2335  Weight: 90.7 kg (200 lb)    Body mass index is 32.28 kg/m.  Estimated Creatinine Clearance: 56.3 mL/min (A) (by C-G formula based on SCr of 1.1 mg/dL (H)).   Based on [facility] policy patient is candidate for enoxaparin 0.5mg /kg TBW SQ every 24 hours based on BMI being >30.  DESCRIPTION: Pharmacy has adjusted enoxaparin dose per [facility] policy.  Patient is now receiving enoxaparin 45 mg every 24 hours    Merryl Hacker, PharmD Clinical Pharmacist  06/19/2023 9:21 AM

## 2023-06-19 NOTE — ED Notes (Signed)
Pt used bedpan. Sheet and chucks changed.

## 2023-06-19 NOTE — ED Provider Notes (Signed)
[facility] Provider Note    Event Date/Time   First MD Initiated Contact with Patient 06/19/23 0440     (approximate)  History   Chief Complaint: Emesis and Fever  HPI  Jackie Robinson is a 67 y.o. female with a past medical history of diabetes, hypertension, presents to the emergency department for 2 to 3 days now of generalized weakness fatigue, fever to 102, lower back pain.  Patient states this started on Thursday evening/Friday morning.  Patient states a history of urinary tract infections with a recent UTI which she had finished the antibiotics.  Patient denies any cough congestion chest pain abdominal pain states some nausea but no vomiting or diarrhea.  Son states temperature of 102 tonight so brought the patient to the emergency department for evaluation.  Patient found to be tachycardic as well to 1 await but temperature had come down to 98.9 on arrival.  Physical Exam   Triage Vital Signs: ED Triage Vitals [06/18/23 2335]  Encounter Vitals Group     BP 104/80     Systolic BP Percentile      Diastolic BP Percentile      Pulse Rate (!) 116     Resp 18     Temp 98.9 F (37.2 C)     Temp Source Oral     SpO2 94 %     Weight 200 lb (90.7 kg)     Height 5\' 6"  (1.676 m)     Head Circumference      Peak Flow      Pain Score      Pain Loc      Pain Education      Exclude from Growth Chart     Most recent vital signs: Vitals:   06/19/23 0353 06/19/23 0443  BP: 96/72   Pulse: (!) 114 (!) 108  Resp: 18   Temp: 97.9 F (36.6 C)   SpO2: 94% 100%    General: Awake, no distress.  CV:  Good peripheral perfusion.  Regular rate and rhythm  Resp:  Normal effort.  Equal breath sounds bilaterally.  Abd:  No distention.  Soft, nontender.  No rebound or guarding.  Benign abdomen  ED Results / Procedures / Treatments   EKG  EKG viewed and interpreted by myself shows sinus tachycardia 121 bpm with a narrow QRS, normal axis, normal intervals,  nonspecific ST changes.  RADIOLOGY  I have reviewed and interpreted the chest x-ray images.  No consolidation on my evaluation. Radiology is read the x-ray is negative   MEDICATIONS ORDERED IN ED: Medications  aztreonam (AZACTAM) 2 g in sodium chloride 0.9 % 100 mL IVPB (has no administration in time range)  metroNIDAZOLE (FLAGYL) IVPB 500 mg (has no administration in time range)  vancomycin (VANCOCIN) IVPB 1000 mg/200 mL premix (has no administration in time range)  lactated ringers bolus 1,000 mL (1,000 mLs Intravenous New Bag/Given 06/19/23 0351)  ondansetron (ZOFRAN) injection 4 mg (4 mg Intravenous Given 06/19/23 0352)  acetaminophen (TYLENOL) tablet 1,000 mg (1,000 mg Oral Given 06/19/23 0353)     IMPRESSION / MDM / ASSESSMENT AND PLAN / ED COURSE  I reviewed the triage vital signs and the nursing notes.  Patient's presentation is most consistent with acute presentation with potential threat to life or bodily function.  Patient presents the emergency department for 2 to 3 days of generalized fatigue weakness nausea fever to 102.  Patient found to be tachycardic borderline hypotensive.  We will  continue to administer fluids.  Patient's lab work has resulted showing a leukocytosis of 16,000, chemistry shows no significant finding borderline anion gap of 14, lactic acid reassuringly normal.  Patient's procalcitonin is elevated indicating likely bacterial infection.  Given the patient's reported fever to 102 earlier today tachycardia and leukocytosis meeting sepsis criteria we will start the patient on broad-spectrum antibiotics.  We will send a urine sample, cultures and continue to closely monitor.  Given the patient's leukocytosis tachycardia borderline hypotension anticipate admission to the hospital service once the patient's emergency department workup is been completed.  Patient's urinalysis has resulted showing urinary tract infection greater than 50 white cells with many bacteria  and white blood cell clumps likely the source of the patient's sepsis.  We will continue IV antibiotics and admit to the hospital service for further workup and treatment.  CRITICAL CARE Performed by: [name]   Total critical care time: 30 minutes  Critical care time was exclusive of separately billable procedures and treating other patients.  Critical care was necessary to treat or prevent imminent or life-threatening deterioration.  Critical care was time spent personally by me on the following activities: development of treatment plan with patient and/or surrogate as well as nursing, discussions with consultants, evaluation of patient's response to treatment, examination of patient, obtaining history from patient or surrogate, ordering and performing treatments and interventions, ordering and review of laboratory studies, ordering and review of radiographic studies, pulse oximetry and re-evaluation of patient's condition.   FINAL CLINICAL IMPRESSION(S) / ED DIAGNOSES   Sepsis Urinary tract infection   Note:  This document was prepared using Dragon voice recognition software and may include unintentional dictation errors.   [name], MD 06/19/23 [phone]

## 2023-06-19 NOTE — Assessment & Plan Note (Signed)
Meeting sepsis criteria with heart rate 100s, white count of 16 Lactate within normal limits Noted overlapping urinary tract infection Procalcitonin 4 IV Rocephin for infectious coverage Panculture LR MIVF  Monitor

## 2023-06-19 NOTE — Progress Notes (Signed)
Blood cultures 1 out of 4 growing Klebsiella with no sensitivities Will otherwise continue treatment course including IV Rocephin for now pending speciation and sensitivities ID consult as clinically indicated

## 2023-06-19 NOTE — ED Notes (Signed)
Patient brought in own home Synthroid medication. This nurse took the form and medication taken to the pharmacy at this time.

## 2023-06-19 NOTE — ED Notes (Signed)
Pt voicing need to urinate. Pt placed on bedpan.

## 2023-06-19 NOTE — ED Notes (Signed)
Pt used bedpan, new chuck pad placed.

## 2023-06-19 NOTE — Sepsis Progress Note (Signed)
Elink following for sepsis protocol. 

## 2023-06-19 NOTE — Progress Notes (Signed)
PHARMACY - PHYSICIAN COMMUNICATION CRITICAL VALUE ALERT - BLOOD CULTURE IDENTIFICATION (BCID)  Jackie Robinson is an 68 y.o. female w/ PMH of CAD, DM, HTN, HLD, hypothyroidism who presented to [facility] on 06/19/2023 with a chief complaint of UTI  Assessment:  1/4 bottles K pneumoniae no resistance patterns detected  Name of physician (or Provider) Contacted: Linganore Blas, MD  Current antibiotics: ceftriaxone 2 grams IV every 24 hours  Changes to prescribed antibiotics recommended:  Patient is on recommended antibiotics - No changes needed  Results for orders placed or performed during the hospital encounter of 06/19/23  Blood Culture ID Panel (Reflexed) (Collected: 06/19/2023 12:13 AM)  Result Value Ref Range   Enterococcus faecalis NOT DETECTED NOT DETECTED   Enterococcus Faecium NOT DETECTED NOT DETECTED   Listeria monocytogenes NOT DETECTED NOT DETECTED   Staphylococcus species NOT DETECTED NOT DETECTED   Staphylococcus aureus (BCID) NOT DETECTED NOT DETECTED   Staphylococcus epidermidis NOT DETECTED NOT DETECTED   Staphylococcus lugdunensis NOT DETECTED NOT DETECTED   Streptococcus species NOT DETECTED NOT DETECTED   Streptococcus agalactiae NOT DETECTED NOT DETECTED   Streptococcus pneumoniae NOT DETECTED NOT DETECTED   Streptococcus pyogenes NOT DETECTED NOT DETECTED   A.calcoaceticus-baumannii NOT DETECTED NOT DETECTED   Bacteroides fragilis NOT DETECTED NOT DETECTED   Enterobacterales DETECTED (A) NOT DETECTED   Enterobacter cloacae complex NOT DETECTED NOT DETECTED   Escherichia coli NOT DETECTED NOT DETECTED   Klebsiella aerogenes NOT DETECTED NOT DETECTED   Klebsiella oxytoca NOT DETECTED NOT DETECTED   Klebsiella pneumoniae DETECTED (A) NOT DETECTED   Proteus species NOT DETECTED NOT DETECTED   Salmonella species NOT DETECTED NOT DETECTED   Serratia marcescens NOT DETECTED NOT DETECTED   Haemophilus influenzae NOT DETECTED NOT DETECTED   Neisseria meningitidis NOT  DETECTED NOT DETECTED   Pseudomonas aeruginosa NOT DETECTED NOT DETECTED   Stenotrophomonas maltophilia NOT DETECTED NOT DETECTED   Candida albicans NOT DETECTED NOT DETECTED   Candida auris NOT DETECTED NOT DETECTED   Candida glabrata NOT DETECTED NOT DETECTED   Candida krusei NOT DETECTED NOT DETECTED   Candida parapsilosis NOT DETECTED NOT DETECTED   Candida tropicalis NOT DETECTED NOT DETECTED   Cryptococcus neoformans/gattii NOT DETECTED NOT DETECTED   CTX-M ESBL NOT DETECTED NOT DETECTED   Carbapenem resistance IMP NOT DETECTED NOT DETECTED   Carbapenem resistance KPC NOT DETECTED NOT DETECTED   Carbapenem resistance NDM NOT DETECTED NOT DETECTED   Carbapenem resist OXA 48 LIKE NOT DETECTED NOT DETECTED   Carbapenem resistance VIM NOT DETECTED NOT DETECTED    [name] 06/19/2023  2:07 PM

## 2023-06-19 NOTE — Assessment & Plan Note (Signed)
Continue Synthroid °

## 2023-06-19 NOTE — ED Notes (Addendum)
Spoke to RN with E-link at this time, asked by RN to add a brief synopsis to clarify timeline of care in chart. See below: Patient arrived at 2318 on 06/18/23, was triaged, and had 2 sets of blood cultures drawn at that time (2351 and 0013). There was not a room immediately available for the patient, but the triage RN was able to obtain orders for fluids and started LR at 0350. Patient was able to be roomed from lobby at [phone] which allowed exam by EDP at 0435. Orders were placed promptly by EDP after assessment, and the first abx (azactam) was started at 0459 (see MAR).

## 2023-06-19 NOTE — Assessment & Plan Note (Signed)
Blood sugar 170s SSI A1c Monitor

## 2023-06-20 ENCOUNTER — Inpatient Hospital Stay: Payer: Medicare Other

## 2023-06-20 DIAGNOSIS — R7881 Bacteremia: Secondary | ICD-10-CM

## 2023-06-20 DIAGNOSIS — B961 Klebsiella pneumoniae [K. pneumoniae] as the cause of diseases classified elsewhere: Secondary | ICD-10-CM

## 2023-06-20 LAB — CBC
HCT: 35.3 % — ABNORMAL LOW (ref 36.0–46.0)
Hemoglobin: 11.4 g/dL — ABNORMAL LOW (ref 12.0–15.0)
MCH: 26.3 pg (ref 26.0–34.0)
MCHC: 32.3 g/dL (ref 30.0–36.0)
MCV: 81.3 fL (ref 80.0–100.0)
Platelets: 318 10*3/uL (ref 150–400)
RBC: 4.34 MIL/uL (ref 3.87–5.11)
RDW: 17.2 % — ABNORMAL HIGH (ref 11.5–15.5)
WBC: 10 10*3/uL (ref 4.0–10.5)
nRBC: 0 % (ref 0.0–0.2)

## 2023-06-20 LAB — COMPREHENSIVE METABOLIC PANEL
ALT: 25 U/L (ref 0–44)
AST: 19 U/L (ref 15–41)
Albumin: 2.9 g/dL — ABNORMAL LOW (ref 3.5–5.0)
Alkaline Phosphatase: 82 U/L (ref 38–126)
Anion gap: 9 (ref 5–15)
BUN: 20 mg/dL (ref 8–23)
CO2: 24 mmol/L (ref 22–32)
Calcium: 7.8 mg/dL — ABNORMAL LOW (ref 8.9–10.3)
Chloride: 102 mmol/L (ref 98–111)
Creatinine, Ser: 0.6 mg/dL (ref 0.44–1.00)
GFR, Estimated: >60 mL/min (ref >60)
Glucose, Bld: 140 mg/dL — ABNORMAL HIGH (ref 70–99)
Potassium: 3.4 mmol/L — ABNORMAL LOW (ref 3.5–5.1)
Sodium: 135 mmol/L (ref 135–145)
Total Bilirubin: 0.5 mg/dL (ref <1.2)
Total Protein: 6.3 g/dL — ABNORMAL LOW (ref 6.5–8.1)

## 2023-06-20 LAB — GLUCOSE, CAPILLARY
Glucose-Capillary: 128 mg/dL — ABNORMAL HIGH (ref 70–99)
Glucose-Capillary: 113 mg/dL — ABNORMAL HIGH (ref 70–99)
Glucose-Capillary: 109 mg/dL — ABNORMAL HIGH (ref 70–99)
Glucose-Capillary: 113 mg/dL — ABNORMAL HIGH (ref 70–99)

## 2023-06-20 LAB — HIV ANTIBODY (ROUTINE TESTING W REFLEX): HIV Screen 4th Generation wRfx: NONREACTIVE

## 2023-06-20 MED ORDER — METOPROLOL SUCCINATE ER 25 MG PO TB24
12.5000 mg | ORAL_TABLET | Freq: Every day | ORAL | Status: DC
  Administered 2023-06-21: 12.5 mg via ORAL
  Filled 2023-06-20: qty 1

## 2023-06-20 MED ORDER — IOHEXOL 350 MG/ML SOLN
125.0000 mL | Freq: Once | INTRAVENOUS | Status: AC | PRN
  Administered 2023-06-20: 125 mL via INTRAVENOUS

## 2023-06-20 NOTE — Progress Notes (Signed)
Progress Note   Patient: Jackie Robinson FAO:[id number] DOB: 10-19-1955 DOA: 06/19/2023     1 DOS: the patient was seen and examined on 06/20/2023   Brief hospital course: 67yo with h/o CAD s/p stent, DM, HTN, HLD, and hypothyroidism who presented on 12/29 with urinary symptoms that persisted despite antibiotics.  She was diagnosed with refractory UTI and started on Ceftriaxone.  Assessment and Plan:  Sepsis due to Klebsiella UTI/bacteremia Fever, back pain, nausea/vomiting, and dysuria x 1 week Failed outpatient course of antibiotics Urinalysis indicative of infection IV Rocephin Blood cultures positive for Klebsiella Case also preliminarily discussed with Dr. Lonna Cobb given prior evaluation for similar symptoms in office CT hematuria study with B pyelo but otherwise unremarkable Outpatient consult urology with Dr. Lonna Cobb - his office will contact her with a later appointment time than is currently scheduled   CAD (coronary artery disease) Noted baseline CAD w/ prior DES CTO LAD 05/22/2021 complicated by perforation and pericardial tamponade  Followed by Dr. Darrold Junker outpatient No active chest pain Continue ASA   Essential hypertension Continue Toprol XL   HLD Continue Zetia, rosuvastatin  Diabetes mellitus type 2, noninsulin dependent  A1c was 6.4 in 12/2022, good control Hold metformin Continue moderate-scale SSI   Hypothyroid Continue Synthroid   SVT (supraventricular tachycardia)  EKG sinus tach with heart rate in the 120s in the setting of sepsis associated with UTI Resolved Continue metoprolol       Consultants: PT OT  Procedures: None  Antibiotics: Ceftriaxone 12/29- Vancomycin x 1  30 Day Unplanned Readmission Risk Score    Flowsheet Row ED to Hosp-Admission (Current) from 06/19/2023 in [facility]  30 Day Unplanned Readmission Risk Score (%) 13.29 Filed at 06/20/2023 0400       This score is the  patient's risk of an unplanned readmission within 30 days of being discharged (0 -100%). The score is based on dignosis, age, lab data, medications, orders, and past utilization.   Low:  0-14.9   Medium: 15-21.9   High: 22-29.9   Extreme: 30 and above           Subjective: Feeling better, no fevers.   Objective: Vitals:   06/20/23 0922 06/20/23 1532  BP: 111/77 103/72  Pulse: 89 77  Resp:    Temp:  98.7 F (37.1 C)  SpO2:  96%    Intake/Output Summary (Last 24 hours) at 06/20/2023 1741 Last data filed at 06/20/2023 0513 Gross per 24 hour  Intake 1619.08 ml  Output --  Net 1619.08 ml   Filed Weights   06/18/23 2335 06/19/23 1722  Weight: 90.7 kg 89.9 kg    Exam:  General:  Appears calm and comfortable and is in NAD Eyes:   EOMI, normal lids, L esotropia ENT:  grossly normal hearing, lips & tongue, mmm Neck:  no LAD, masses or thyromegaly Cardiovascular:  RRR, no m/r/g. No LE edema.  Respiratory:   CTA bilaterally with no wheezes/rales/rhonchi.  Normal respiratory effort. Abdomen:  soft, NT, ND Back:   normal alignment, no CVAT Skin:  no rash or induration seen on limited exam Musculoskeletal:  grossly normal tone BUE/BLE, good ROM, no bony abnormality Psychiatric:  grossly normal mood and affect, speech fluent and appropriate, AOx3 Neurologic:  CN 2-12 grossly intact, moves all extremities in coordinated fashion  Data Reviewed: I have reviewed the patient's lab results since admission.  Pertinent labs for today include:   K+ 3.4 Glucose 140 Albumin 2.9 WBC 10 Hgb 11.4  Lactate 1.6 UA: moderate Hgb, 5 ketones, moderate LE, >300 protein, many bacteria BCID Klebsiella pneumoniae     Family Communication: Son was present throughout evaluation  Disposition: Status is: Inpatient Remains inpatient appropriate because: ongoing treatment with IV antibiotics     Time spent: 50 minutes  Unresulted Labs (From admission, onward)     Start     Ordered    06/21/23 0500  CBC with Differential/Platelet  Tomorrow morning,   R        06/20/23 1741   06/21/23 0500  Basic metabolic panel  Tomorrow morning,   R        06/20/23 1741   06/19/23 0926  Hemoglobin A1c  Once,   R       Comments: To assess prior glycemic control    06/19/23 8416             Author: Jonah Blue, MD 06/20/2023 5:41 PM  For on call review [email or web address].

## 2023-06-20 NOTE — TOC CM/SW Note (Signed)
Transition of Care [facility]) - Inpatient Brief Assessment   Patient Details  Name: Jackie Robinson MRN: [id number] Date of Birth: [birth year]  Transition of Care [facility]) CM/SW Contact:    Chapman Fitch, RN Phone Number: 06/20/2023, 10:41 AM   Clinical Narrative:   Transition of Care [facility]) Screening Note   Patient Details  Name: Jackie Robinson Date of Birth: Jul 20, 1955   Transition of Care [facility]) CM/SW Contact:    Chapman Fitch, RN Phone Number: 06/20/2023, 10:42 AM    Transition of Care Department [facility]) has reviewed patient and no TOC needs have been identified at this time. . If new patient transition needs arise, please place a TOC consult.   TOC consulted for home health.  No home health needs identified at this time. Please consult if needs arise   Transition of Care Asessment: Insurance and Status: Insurance coverage has been reviewed       Prior/Current Home Services: No current home services Social Drivers of Health Review: SDOH reviewed no interventions necessary Readmission risk has been reviewed: Yes Transition of care needs: no transition of care needs at this time

## 2023-06-20 NOTE — ED Notes (Signed)
Patient taken up by wheelchair to inpatient room along with all belongings and form for home medications given to nurse.

## 2023-06-20 NOTE — Evaluation (Signed)
Physical Therapy Evaluation Patient Details Name: Jackie Robinson MRN: [id number] DOB: Sep 29, 1955 Today's Date: 06/20/2023  History of Present Illness  Patient is a 67 year old female with emesis, fever, UTI, possible sepsis.  Clinical Impression  Patient agreeable to PT evaluation. She is independent with mobility at baseline, works, drives, and lives alone.  Today the patient is fatigued with mobility. She is Mod I for bed mobility, supervision for transfers. Increased time required with all functional mobility. She is mildly unsteady with ambulation but required only CGA without assistive device. Recommend to continue PT while in the hospital to maximize independence and facilitate return to prior level of function. No PT needs anticipated after this hospital stay at this time.       If plan is discharge home, recommend the following: Help with stairs or ramp for entrance;Assist for transportation   Can travel by private vehicle        Equipment Recommendations None recommended by PT  Recommendations for Other Services       Functional Status Assessment Patient has had a recent decline in their functional status and demonstrates the ability to make significant improvements in function in a reasonable and predictable amount of time.     Precautions / Restrictions Precautions Precautions: Fall Restrictions Weight Bearing Restrictions Per Provider Order: No      Mobility  Bed Mobility Overal bed mobility: Modified Independent                  Transfers Overall transfer level: Needs assistance Equipment used: None Transfers: Sit to/from Stand Sit to Stand: Supervision           General transfer comment: increased time with all mobility    Ambulation/Gait Ambulation/Gait assistance: Contact guard assist Gait Distance (Feet): 45 Feet Assistive device: None Gait Pattern/deviations: Decreased stride length, Step-through pattern Gait velocity: decreased      General Gait Details: mild unsteadiness with ambulation with decreased gait velocity. no shortness of breath  Stairs            Wheelchair Mobility     Tilt Bed    Modified Rankin (Stroke Patients Only)       Balance                                             Pertinent Vitals/Pain Pain Assessment Pain Assessment: Faces Faces Pain Scale: Hurts a little bit Pain Location: L knee Pain Descriptors / Indicators: Discomfort Pain Intervention(s): Limited activity within patient's tolerance, Monitored during session    Home Living Family/patient expects to be discharged to:: Private residence Living Arrangements: Alone Available Help at Discharge: Family;Available PRN/intermittently Type of Home: Mobile home Home Access: Stairs to enter Entrance Stairs-Rails: Right;Left Entrance Stairs-Number of Steps: 5   Home Layout: One level Home Equipment: None Additional Comments: family member lives next door, can provide intermittent assistance    Prior Function Prior Level of Function : Independent/Modified Independent;Working/employed;Driving             Mobility Comments: independent, works a few days a week ADLs Comments: independent     Extremity/Trunk Assessment   Upper Extremity Assessment Upper Extremity Assessment: Defer to OT evaluation    Lower Extremity Assessment Lower Extremity Assessment: Generalized weakness       Communication   Communication Communication: No apparent difficulties  Cognition Arousal: Alert Behavior During Therapy: [facility]  for tasks assessed/performed Overall Cognitive Status: Within Functional Limits for tasks assessed                                          General Comments General comments (skin integrity, edema, etc.): patient is fatigued with activity    Exercises     Assessment/Plan    PT Assessment Patient needs continued PT services  PT Problem List Decreased  strength;Decreased activity tolerance;Decreased balance;Decreased mobility       PT Treatment Interventions DME instruction;Gait training;Stair training;Functional mobility training;Therapeutic activities;Therapeutic exercise;Balance training    PT Goals (Current goals can be found in the Care Plan section)  Acute Rehab PT Goals Patient Stated Goal: to return to prior level of independence PT Goal Formulation: With patient Time For Goal Achievement: 07/04/23 Potential to Achieve Goals: Good    Frequency Min 1X/week     Co-evaluation PT/OT/SLP Co-Evaluation/Treatment: Yes Reason for Co-Treatment: Complexity of the patient's impairments (multi-system involvement);Other (comment) (low endurance) PT goals addressed during session: Mobility/safety with mobility         AM-PAC PT "6 Clicks" Mobility  Outcome Measure Help needed turning from your back to your side while in a flat bed without using bedrails?: None Help needed moving from lying on your back to sitting on the side of a flat bed without using bedrails?: None Help needed moving to and from a bed to a chair (including a wheelchair)?: A Little Help needed standing up from a chair using your arms (e.g., wheelchair or bedside chair)?: A Little Help needed to walk in hospital room?: A Little Help needed climbing 3-5 steps with a railing? : A Little 6 Click Score: 20    End of Session   Activity Tolerance: Patient tolerated treatment well;Patient limited by fatigue Patient left: in bed;with call bell/phone within reach;with bed alarm set   PT Visit Diagnosis: Unsteadiness on feet (R26.81);Muscle weakness (generalized) (M62.81)    Time: 0865-7846 PT Time Calculation (min) (ACUTE ONLY): 19 min   Charges:   PT Evaluation $PT Eval Low Complexity: 1 Low   PT General Charges $$ ACUTE PT VISIT: 1 Visit         Donna Bernard, PT, MPT   Ina Homes 06/20/2023, 10:13 AM

## 2023-06-20 NOTE — Hospital Course (Signed)
67yo with h/o CAD s/p stent, DM, HTN, HLD, and hypothyroidism who presented on 12/29 with urinary symptoms that persisted despite antibiotics.  She was diagnosed with refractory UTI and started on Ceftriaxone.

## 2023-06-20 NOTE — Evaluation (Signed)
Occupational Therapy Evaluation Patient Details Name: Jackie Robinson MRN: [id number] DOB: 02/03/1956 Today's Date: 06/20/2023   History of Present Illness Patient is a 67 year old female with emesis, fever, UTI, possible sepsis.   Clinical Impression   Pt was seen for PT/OT co-evaluation this date. Prior to hospital admission, pt lived alone and reports her son has been in town a lot often to assist as needed. She was IND at baseline for ADLs and mobility without AD use. 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  Pt presents to acute OT demonstrating impaired ADL performance and functional mobility 2/2 low activity tolerance, mild balance deficits (See OT problem list for additional functional deficits). Pt currently requires MOD I for bed mobility, SUP for STS, and CGA/close SUP for mobility from bed<>toilet. CGA/SBA for toilet transfer, SUP for clothing management and toileting hygiene. Able to stand at sink to wash hands with SBA. Pt fatigued easily with activity, but did not use AD.  Pt would benefit from skilled OT services to address noted impairments and functional limitations (see below for any additional details) in order to maximize safety and independence while minimizing falls risk and caregiver burden. Do not anticipate the need for follow up OT services upon acute hospital DC.        If plan is discharge home, recommend the following: A little help with walking and/or transfers;A little help with bathing/dressing/bathroom;Assist for transportation;Assistance with cooking/housework;Help with stairs or ramp for entrance    Functional Status Assessment  Patient has had a recent decline in their functional status and demonstrates the ability to make significant improvements in function in a reasonable and predictable amount of time.  Equipment Recommendations  None recommended by OT    Recommendations for Other Services       Precautions / Restrictions  Precautions Precautions: Fall Restrictions Weight Bearing Restrictions Per Provider Order: No      Mobility Bed Mobility Overal bed mobility: Modified Independent                  Transfers Overall transfer level: Needs assistance Equipment used: None Transfers: Sit to/from Stand Sit to Stand: Supervision           General transfer comment: slow pace during gait, CGA/close SBA provided      Balance Overall balance assessment: Needs assistance   Sitting balance-Leahy Scale: Good       Standing balance-Leahy Scale: Good Standing balance comment: no LOB, but some swaying during gait                           ADL either performed or assessed with clinical judgement   ADL Overall ADL's : Needs assistance/impaired     Grooming: Wash/dry hands;Standing;Contact guard assist;Supervision/safety               Lower Body Dressing: Sitting/lateral leans;Modified independent   Toilet Transfer: Contact guard assist;Grab bars   Toileting- Clothing Manipulation and Hygiene: Supervision/safety;Sitting/lateral lean               Vision         Perception         Praxis         Pertinent Vitals/Pain Pain Assessment Pain Assessment: Faces Faces Pain Scale: Hurts a little bit Pain Location: L knee Pain Descriptors / Indicators: Discomfort Pain Intervention(s): Monitored during session, Limited activity within patient's tolerance     Extremity/Trunk Assessment  Upper Extremity Assessment Upper Extremity Assessment: Overall WFL for tasks assessed   Lower Extremity Assessment Lower Extremity Assessment: Generalized weakness       Communication Communication Communication: No apparent difficulties   Cognition Arousal: Alert Behavior During Therapy: WFL for tasks assessed/performed Overall Cognitive Status: Within Functional Limits for tasks assessed                                       General Comments  fatigues  easily    Exercises Other Exercises Other Exercises: Edu on role of OT in acute setting and importance of therapy to maximize strength/safety/IND.   Shoulder Instructions      Home Living Family/patient expects to be discharged to:: Private residence Living Arrangements: Alone Available Help at Discharge: Family;Available PRN/intermittently Type of Home: Mobile home Home Access: Stairs to enter Entrance Stairs-Number of Steps: 5 Entrance Stairs-Rails: Right;Left Home Layout: One level     Bathroom Shower/Tub: Chief Strategy Officer: Standard     Home Equipment: None   Additional Comments: family member lives next door, can provide intermittent assistance      Prior Functioning/Environment Prior Level of Function : Independent/Modified Independent;Working/employed;Driving             Mobility Comments: independent, works a few days a week ADLs Comments: independent        OT Problem List: Decreased strength;Decreased activity tolerance;Impaired balance (sitting and/or standing)      OT Treatment/Interventions: Self-care/ADL training;Therapeutic exercise;Therapeutic activities;Energy conservation;DME and/or AE instruction;Balance training;Patient/family education    OT Goals(Current goals can be found in the care plan section) Acute Rehab OT Goals Patient Stated Goal: return home OT Goal Formulation: With patient Time For Goal Achievement: 07/04/23 Potential to Achieve Goals: Good ADL Goals Pt Will Perform Lower Body Bathing: with modified independence;sitting/lateral leans;sit to/from stand Pt Will Perform Lower Body Dressing: with modified independence;sitting/lateral leans;sit to/from stand Pt Will Transfer to Toilet: with modified independence;grab bars;regular height toilet;ambulating Pt Will Perform Toileting - Clothing Manipulation and hygiene: with modified independence;sit to/from stand;sitting/lateral leans Additional ADL Goal #1: Pt will  verbalize/demo use of compensatory and energy conservation strategies to maximize her IND/safety with ADLs.  OT Frequency: Min 1X/week    Co-evaluation PT/OT/SLP Co-Evaluation/Treatment: Yes Reason for Co-Treatment: Complexity of the patient's impairments (multi-system involvement);Other (comment) PT goals addressed during session: Mobility/safety with mobility OT goals addressed during session: ADL's and self-care      AM-PAC OT "6 Clicks" Daily Activity     Outcome Measure Help from another person eating meals?: None Help from another person taking care of personal grooming?: None Help from another person toileting, which includes using toliet, bedpan, or urinal?: A Little Help from another person bathing (including washing, rinsing, drying)?: None Help from another person to put on and taking off regular upper body clothing?: A Little Help from another person to put on and taking off regular lower body clothing?: None 6 Click Score: 22   End of Session Nurse Communication: Mobility status  Activity Tolerance: Patient tolerated treatment well Patient left: in bed;with call bell/phone within reach;with bed alarm set  OT Visit Diagnosis: Other abnormalities of gait and mobility (R26.89)                Time: 7829-5621 OT Time Calculation (min): 17 min Charges:  OT General Charges $OT Visit: 1 Visit OT Evaluation $OT Eval Low Complexity: 1 Low  Wong Steadham  Brysan Mcevoy, OTR/L 06/20/23, 12:36 PM  Constance Goltz 06/20/2023, 12:32 PM

## 2023-06-21 DIAGNOSIS — A4159 Other Gram-negative sepsis: Secondary | ICD-10-CM | POA: Diagnosis not present

## 2023-06-21 LAB — CBC WITH DIFFERENTIAL/PLATELET
Abs Immature Granulocytes: 0.03 10*3/uL (ref 0.00–0.07)
Basophils Absolute: 0 10*3/uL (ref 0.0–0.1)
Basophils Relative: 0 %
Eosinophils Absolute: 0 10*3/uL (ref 0.0–0.5)
Eosinophils Relative: 0 %
HCT: 33.6 % — ABNORMAL LOW (ref 36.0–46.0)
Hemoglobin: 10.8 g/dL — ABNORMAL LOW (ref 12.0–15.0)
Immature Granulocytes: 0 %
Lymphocytes Relative: 30 %
Lymphs Abs: 3.5 10*3/uL (ref 0.7–4.0)
MCH: 26.2 pg (ref 26.0–34.0)
MCHC: 32.1 g/dL (ref 30.0–36.0)
MCV: 81.6 fL (ref 80.0–100.0)
Monocytes Absolute: 1.7 10*3/uL — ABNORMAL HIGH (ref 0.1–1.0)
Monocytes Relative: 14 %
Neutro Abs: 6.4 10*3/uL (ref 1.7–7.7)
Neutrophils Relative %: 56 %
Platelets: 335 10*3/uL (ref 150–400)
RBC: 4.12 MIL/uL (ref 3.87–5.11)
RDW: 17.3 % — ABNORMAL HIGH (ref 11.5–15.5)
WBC: 11.6 10*3/uL — ABNORMAL HIGH (ref 4.0–10.5)
nRBC: 0.3 % — ABNORMAL HIGH (ref 0.0–0.2)

## 2023-06-21 LAB — BASIC METABOLIC PANEL
Anion gap: 8 (ref 5–15)
BUN: 24 mg/dL — ABNORMAL HIGH (ref 8–23)
CO2: 27 mmol/L (ref 22–32)
Calcium: 8.3 mg/dL — ABNORMAL LOW (ref 8.9–10.3)
Chloride: 105 mmol/L (ref 98–111)
Creatinine, Ser: 0.77 mg/dL (ref 0.44–1.00)
GFR, Estimated: >60 mL/min (ref >60)
Glucose, Bld: 92 mg/dL (ref 70–99)
Potassium: 3.3 mmol/L — ABNORMAL LOW (ref 3.5–5.1)
Sodium: 140 mmol/L (ref 135–145)

## 2023-06-21 LAB — CULTURE, BLOOD (ROUTINE X 2)

## 2023-06-21 LAB — HEMOGLOBIN A1C
Hgb A1c MFr Bld: 6.3 % — ABNORMAL HIGH (ref 4.8–5.6)
Mean Plasma Glucose: 134 mg/dL

## 2023-06-21 LAB — GLUCOSE, CAPILLARY
Glucose-Capillary: 107 mg/dL — ABNORMAL HIGH (ref 70–99)
Glucose-Capillary: 89 mg/dL (ref 70–99)

## 2023-06-21 MED ORDER — LEVOFLOXACIN 750 MG PO TABS: 750.0000 mg | ORAL_TABLET | Freq: Every day | ORAL | 0 refills | Status: AC

## 2023-06-21 MED ORDER — POTASSIUM CHLORIDE CRYS ER 20 MEQ PO TBCR
40.0000 meq | EXTENDED_RELEASE_TABLET | Freq: Once | ORAL | Status: AC
Start: 2023-06-21 — End: 2023-06-21
  Administered 2023-06-21: 40 meq via ORAL
  Filled 2023-06-21: qty 2

## 2023-06-21 MED ORDER — METOPROLOL SUCCINATE ER 25 MG PO TB24: 12.5000 mg | ORAL_TABLET | Freq: Every day | ORAL | 1 refills | Status: AC

## 2023-06-21 MED ORDER — MELATONIN 5 MG PO TABS
5.0000 mg | ORAL_TABLET | Freq: Once | ORAL | Status: AC
  Administered 2023-06-21: 5 mg via ORAL
  Filled 2023-06-21: qty 1

## 2023-06-21 NOTE — Plan of Care (Signed)
  Problem: Education: Goal: Ability to describe self-care measures that may prevent or decrease complications (Diabetes Survival Skills Education) will improve Outcome: Adequate for Discharge Goal: Individualized Educational Video(s) Outcome: Adequate for Discharge   Problem: Coping: Goal: Ability to adjust to condition or change in health will improve Outcome: Adequate for Discharge   Problem: Fluid Volume: Goal: Ability to maintain a balanced intake and output will improve Outcome: Adequate for Discharge   Problem: Health Behavior/Discharge Planning: Goal: Ability to identify and utilize available resources and services will improve Outcome: Adequate for Discharge Goal: Ability to manage health-related needs will improve Outcome: Adequate for Discharge   Problem: Metabolic: Goal: Ability to maintain appropriate glucose levels will improve Outcome: Adequate for Discharge   Problem: Nutritional: Goal: Maintenance of adequate nutrition will improve Outcome: Adequate for Discharge Goal: Progress toward achieving an optimal weight will improve Outcome: Adequate for Discharge   Problem: Skin Integrity: Goal: Risk for impaired skin integrity will decrease Outcome: Adequate for Discharge   Problem: Tissue Perfusion: Goal: Adequacy of tissue perfusion will improve Outcome: Adequate for Discharge   Problem: Fluid Volume: Goal: Hemodynamic stability will improve Outcome: Adequate for Discharge   Problem: Clinical Measurements: Goal: Diagnostic test results will improve Outcome: Adequate for Discharge Goal: Signs and symptoms of infection will decrease Outcome: Adequate for Discharge   Problem: Respiratory: Goal: Ability to maintain adequate ventilation will improve Outcome: Adequate for Discharge   Problem: Education: Goal: Knowledge of General Education information will improve Description: Including pain rating scale, medication(s)/side effects and non-pharmacologic  comfort measures Outcome: Adequate for Discharge   Problem: Health Behavior/Discharge Planning: Goal: Ability to manage health-related needs will improve Outcome: Adequate for Discharge   Problem: Clinical Measurements: Goal: Ability to maintain clinical measurements within normal limits will improve Outcome: Adequate for Discharge Goal: Will remain free from infection Outcome: Adequate for Discharge Goal: Diagnostic test results will improve Outcome: Adequate for Discharge Goal: Respiratory complications will improve Outcome: Adequate for Discharge Goal: Cardiovascular complication will be avoided Outcome: Adequate for Discharge   Problem: Activity: Goal: Risk for activity intolerance will decrease Outcome: Adequate for Discharge   Problem: Nutrition: Goal: Adequate nutrition will be maintained Outcome: Adequate for Discharge   Problem: Coping: Goal: Level of anxiety will decrease Outcome: Adequate for Discharge   Problem: Elimination: Goal: Will not experience complications related to bowel motility Outcome: Adequate for Discharge Goal: Will not experience complications related to urinary retention Outcome: Adequate for Discharge   Problem: Pain Management: Goal: General experience of comfort will improve Outcome: Adequate for Discharge   Problem: Safety: Goal: Ability to remain free from injury will improve Outcome: Adequate for Discharge   Problem: Skin Integrity: Goal: Risk for impaired skin integrity will decrease Outcome: Adequate for Discharge

## 2023-06-21 NOTE — Discharge Summary (Signed)
 Physician Discharge Summary   Patient: Jackie Robinson MRN: [id number] DOB: May 03, 1956  Admit date:     06/19/2023  Discharge date: 06/21/23  Discharge Physician: Jackie Robinson   PCP: Jackie Manna, MD   Recommendations at discharge:   Continue antibiotics until gone (Levaquin  daily until 06/25/23) Follow up with Dr. Sadie in 1-2 weeks; will need to review follow up blood cultures to ensure that no additional antibiotics are needed and repeat CBC/BMP Jackie Robinson from urology will contact you with an appointment in about 2 weeks  Discharge Diagnoses: Principal Problem:   Klebsiella sepsis (HCC) Active Problems:   SVT (supraventricular tachycardia) (HCC)   Hypothyroid   Diabetes mellitus type 2, noninsulin dependent (HCC)   Essential hypertension   CAD (coronary artery disease)    Hospital Course: 67yo with h/o CAD s/p stent, DM, HTN, HLD, and hypothyroidism who presented on 12/29 with urinary symptoms that persisted despite antibiotics.  She was diagnosed with refractory UTI and started on Ceftriaxone .  Assessment and Plan:  Sepsis due to Klebsiella UTI/bacteremia Fever, back pain, nausea/vomiting, and dysuria x 1 week Failed outpatient course of antibiotics Urinalysis indicative of infection IV Rocephin  -> Levaquin  750 mg daily through 06/25/2023 Blood cultures positive for Klebsiella CT hematuria study with B pyelo but otherwise unremarkable Outpatient consult urology with Jackie Robinson - his office will contact her with a later appointment time than is currently scheduled   CAD (coronary artery disease) Noted baseline CAD w/ prior DES CTO LAD 05/22/2021 complicated by perforation and pericardial tamponade  Followed by Jackie Robinson outpatient No active chest pain Continue ASA   Essential hypertension Continue Toprol  XL   HLD Continue Zetia , rosuvastatin    Diabetes mellitus type 2, noninsulin dependent  A1c was 6.4 in 12/2022, good control Resume metformin     Hypothyroid Continue Synthroid    SVT (supraventricular tachycardia)  EKG sinus tach with heart rate in the 120s in the setting of sepsis associated with UTI Resolved Continue metoprolol  9 beat run of V tach noted on 12/30  Obesity Body mass index is 31.99 kg/m.Jackie Robinson  Weight loss should be encouraged Outpatient PCP/bariatric medicine f/u encouraged           Consultants: PT OT   Procedures: None   Antibiotics: Ceftriaxone  12/29-31 Vancomycin  x 1 Levaquin  750 mg PO daily 12/31-1/4     Pain control - [place]  Controlled Substance Reporting System database was reviewed. and patient was instructed, not to drive, operate heavy machinery, perform activities at heights, swimming or participation in water activities or provide baby-sitting services while on Pain, Sleep and Anxiety Medications; until their outpatient Physician has advised to do so again. Also recommended to not to take more than prescribed Pain, Sleep and Anxiety Medications.    Disposition: Home Diet recommendation:  Cardiac and Carb modified diet DISCHARGE MEDICATION: Allergies as of 06/21/2023       Reactions   Statins Other (See Comments)   Muscle Aches   Amoxicillin Rash   Contrast Media [iodinated Contrast Media] Rash   Septra [sulfamethoxazole-trimethoprim] Rash        Medication List     TAKE these medications    aspirin  EC 81 MG tablet Take 1 tablet (81 mg total) by mouth daily. Swallow whole.   ezetimibe  10 MG tablet Commonly known as: ZETIA  Take 10 mg by mouth in the morning.   fexofenadine-pseudoephedrine 60-120 MG 12 hr tablet Commonly known as: ALLEGRA-D Take 1 tablet by mouth daily as needed (allergies).   levofloxacin  750 MG  tablet Commonly known as: Levaquin  Take 1 tablet (750 mg total) by mouth daily for 5 days.   levothyroxine  88 MCG tablet Commonly known as: SYNTHROID  Take 88 mcg by mouth daily before breakfast.   metFORMIN  500 MG tablet Commonly known as:  GLUCOPHAGE  Take 500 mg by mouth 2 (two) times daily with a meal.   metoprolol  succinate 25 MG 24 hr tablet Commonly known as: TOPROL -XL Take 0.5 tablets (12.5 mg total) by mouth daily. Take with or immediately following a meal.   nitroGLYCERIN  0.4 MG SL tablet Commonly known as: NITROSTAT  Place under the tongue.   pantoprazole  40 MG tablet Commonly known as: PROTONIX  Take 40 mg by mouth in the morning.   PROBIOTIC DAILY PO Take 1 capsule by mouth in the morning.   Repatha SureClick 140 MG/ML Soaj Generic drug: Evolocumab Inject 140 mg into the skin every 14 (fourteen) days.   rosuvastatin  5 MG tablet Commonly known as: CRESTOR  Take 5 mg by mouth daily.   Systane Ultra PF 0.4-0.3 % Soln Generic drug: Polyethyl Glyc-Propyl Glyc PF Place 1-2 drops into both eyes 2 (two) times daily as needed (for thyroid  eye disease (dryness or irritation)).   traMADol  50 MG tablet Commonly known as: ULTRAM  Take 50 mg by mouth 3 (three) times daily as needed.   Vitamin D (Ergocalciferol) 1.25 MG (50000 UNIT) Caps capsule Commonly known as: DRISDOL Take 50,000 Units by mouth every Wednesday.   Vitamin E 268 MG (400 UNIT) Caps Take 400 Units by mouth in the morning.        Discharge Exam:   Subjective: Did not sleep well, having neck pain from the bed, chronic L knee pain but missed her ortho appointment (rescheduled for 06/29/23).   Objective: Vitals:   06/21/23 0344 06/21/23 0722  BP: 117/70 125/80  Pulse: 82 87  Resp: 20 16  Temp: 98.3 F (36.8 C) 98.2 F (36.8 C)  SpO2: 94% 94%   No intake or output data in the 24 hours ending 06/21/23 1142 Filed Weights   06/18/23 2335 06/19/23 1722  Weight: 90.7 kg 89.9 kg    Exam:  General:  Appears calm and comfortable and is in NAD Eyes:   EOMI, normal lids, L esotropia ENT:  grossly normal hearing, lips & tongue, mmm Neck:  no LAD, masses or thyromegaly Cardiovascular:  RRR, no m/r/g. No LE edema.  Respiratory:   CTA  bilaterally with no wheezes/rales/rhonchi.  Normal respiratory effort. Abdomen:  soft, NT, ND Back:   normal alignment, no CVAT Skin:  no rash or induration seen on limited exam Musculoskeletal:  grossly normal tone BUE/BLE, good ROM, no bony abnormality Psychiatric:  grossly normal mood and affect, speech fluent and appropriate, AOx3 Neurologic:  CN 2-12 grossly intact, moves all extremities in coordinated fashion  Data Reviewed: I have reviewed the patient's lab results since admission.  Pertinent labs for today include:   K+ 3.3 WBC 11.6, up from 10 Hgb 10.8 Blood cultures + Klebsiella pneumoniae Repeat blood cultures ordered 12/31    Condition at discharge: improving  The results of significant diagnostics from this hospitalization (including imaging, microbiology, ancillary and laboratory) are listed below for reference.   Imaging Studies: CT HEMATURIA WORKUP Result Date: 06/20/2023 CLINICAL DATA:  Recurrent urinary tract infection. Back pain, nausea, vomiting, dysuria. EXAM: CT ABDOMEN AND PELVIS WITHOUT AND WITH CONTRAST TECHNIQUE: Multidetector CT imaging of the abdomen and pelvis was performed following the standard protocol before and following the bolus administration of intravenous contrast. RADIATION  DOSE REDUCTION: This exam was performed according to the departmental dose-optimization program which includes automated exposure control, adjustment of the mA and/or kV according to patient size and/or use of iterative reconstruction technique. CONTRAST:  OMNIPAQUE  IOHEXOL  350 MG/ML SOLN COMPARISON:  CTA 07/17/2021 FINDINGS: Lower chest: Subsegmental atelectasis in the dependent lower lobes. No pleural effusion. Small hiatal hernia. Hepatobiliary: No focal liver abnormality is seen. No gallstones, gallbladder wall thickening, or biliary dilatation. Pancreas: Unremarkable. No pancreatic ductal dilatation or surrounding inflammatory changes. Spleen: Status post splenectomy  with left upper quadrant splenosis. Adrenals/Urinary Tract: No adrenal nodule. Nonobstructive 8 mm stone in the lower pole of the left kidney. Heterogeneous area of decreased enhancement involving the upper pole of the left kidney. Findings are consistent with focal pyelonephritis. Lesser heterogeneous enhancement in the posterior upper pole of the right kidney, which also may represent pyelonephritis. No intrarenal or perirenal fluid collection. Subcentimeter cyst in the upper pole of the left kidney was present on prior exam. No further follow-up imaging is recommended. No ureteral filling defects on delayed phase imaging. The urinary bladder is unremarkable. No bladder wall thickening. No perivesicular fat stranding. Stomach/Bowel: Minimal colonic diverticulosis. No diverticulitis. There is colonic redundancy. Moderate to large volume of stool in the proximal colon, with small volume of colonic stool distally. No small bowel distension or obstruction. The appendix is not seen. Small hiatal hernia. Vascular/Lymphatic: Aortic atherosclerosis without aneurysm. Patent portal vein. No suspicious lymphadenopathy. Reproductive: Uterus and bilateral adnexa are unremarkable. Other: Postsurgical change of the anterior abdominal wall. No free air or ascites. No abdominal wall hernia. Musculoskeletal: Multilevel degenerative change in the spine. Posterior spurring at T11-T12. Findings suggestive of healed fracture deformity of the left pubic body. IMPRESSION: 1. Heterogeneous area of decreased enhancement involving the upper pole of the left kidney, consistent with focal pyelonephritis. Lesser heterogeneous enhancement in the posterior upper pole of the right kidney, which also may represent pyelonephritis. No intrarenal or perirenal fluid collection. 2. Nonobstructive 8 mm stone in the lower pole of the left kidney. 3. Status post splenectomy with left upper quadrant splenosis. 4. Small hiatal hernia. Aortic Atherosclerosis  (ICD10-I70.0). Electronically Signed   By: Andrea Gasman M.D.   On: 06/20/2023 01:03   DG Chest Port 1 View Result Date: 06/19/2023 CLINICAL DATA:  Possible sepsis EXAM: PORTABLE CHEST 1 VIEW COMPARISON:  07/17/2021 FINDINGS: Cardiac shadow is stable. Scarring is again seen in the left base. No focal infiltrate or effusion is noted. No bony abnormality is seen. IMPRESSION: No active disease. Electronically Signed   By: Oneil Devonshire M.D.   On: 06/19/2023 00:38    Microbiology: Results for orders placed or performed during the hospital encounter of 06/19/23  Blood Culture (routine x 2)     Status: Abnormal (Preliminary result)   Collection Time: 06/18/23 11:51 PM   Specimen: BLOOD  Result Value Ref Range Status   Specimen Description   Final    BLOOD RIGHT ANTECUBITAL Performed at [facility], [address] [place], [place] [address]    Special Requests   Final    BOTTLES DRAWN AEROBIC AND ANAEROBIC Blood Culture adequate volume Performed at [facility], [address], [place], [place] [address]    Culture  Setup Time   Final    GRAM NEGATIVE RODS IN BOTH AEROBIC AND ANAEROBIC BOTTLES CRITICAL RESULT CALLED TO, READ BACK BY AND VERIFIED WITH: PHARMD AMANDA C 1406 [id number] FCP    Culture (A)  Final    KLEBSIELLA  PNEUMONIAE SUSCEPTIBILITIES PERFORMED ON PREVIOUS CULTURE WITHIN THE LAST 5 DAYS. Performed at [facility], [address] [place], [place] [address]    Report Status PENDING  Incomplete  Blood Culture (routine x 2)     Status: Abnormal   Collection Time: 06/19/23 12:13 AM   Specimen: BLOOD LEFT ARM  Result Value Ref Range Status   Specimen Description   Final    BLOOD LEFT ARM Performed at [facility], [address] [place], [place] [address]    Special Requests   Final    BOTTLES DRAWN AEROBIC AND ANAEROBIC Blood Culture results may not be optimal due to an inadequate volume of blood received in culture bottles Performed  at [facility], [address] [place], [place] [address]    Culture  Setup Time   Final    GRAM NEGATIVE RODS CRITICAL RESULT CALLED TO, READ BACK BY AND VERIFIED WITH: RODNEY GRUBB, 06/19/23 @ 1404 BY SH IN BOTH AEROBIC AND ANAEROBIC BOTTLES GRAM STAIN REVIEWED-AGREE WITH RESULT Performed at [facility], [address] [place], [place] [address]    Culture KLEBSIELLA PNEUMONIAE (A)  Final   Report Status 06/21/2023 FINAL  Final   Organism ID, Bacteria KLEBSIELLA PNEUMONIAE  Final   Organism ID, Bacteria KLEBSIELLA PNEUMONIAE  Final      Susceptibility   Klebsiella pneumoniae - MIC*    AMPICILLIN >=32 RESISTANT Resistant     CEFEPIME <=0.12 SENSITIVE Sensitive     CEFTAZIDIME <=1 SENSITIVE Sensitive     CEFTRIAXONE  <=0.25 SENSITIVE Sensitive     CIPROFLOXACIN  <=0.25 SENSITIVE Sensitive     GENTAMICIN <=1 SENSITIVE Sensitive     IMIPENEM <=0.25 SENSITIVE Sensitive     TRIMETH/SULFA <=20 SENSITIVE Sensitive     AMPICILLIN/SULBACTAM 4 SENSITIVE Sensitive     PIP/TAZO <=4 SENSITIVE Sensitive ug/mL   Klebsiella pneumoniae - KIRBY BAUER*    CEFAZOLIN INTERMEDIATE Intermediate     * KLEBSIELLA PNEUMONIAE    KLEBSIELLA PNEUMONIAE  Blood Culture ID Panel (Reflexed)     Status: Abnormal   Collection Time: 06/19/23 12:13 AM  Result Value Ref Range Status   Enterococcus faecalis NOT DETECTED NOT DETECTED Final   Enterococcus Faecium NOT DETECTED NOT DETECTED Final   Listeria monocytogenes NOT DETECTED NOT DETECTED Final   Staphylococcus species NOT DETECTED NOT DETECTED Final   Staphylococcus aureus (BCID) NOT DETECTED NOT DETECTED Final   Staphylococcus epidermidis NOT DETECTED NOT DETECTED Final   Staphylococcus lugdunensis NOT DETECTED NOT DETECTED Final   Streptococcus species NOT DETECTED NOT DETECTED Final   Streptococcus agalactiae NOT DETECTED NOT DETECTED Final   Streptococcus pneumoniae NOT DETECTED NOT DETECTED Final   Streptococcus pyogenes NOT  DETECTED NOT DETECTED Final   A.calcoaceticus-baumannii NOT DETECTED NOT DETECTED Final   Bacteroides fragilis NOT DETECTED NOT DETECTED Final   Enterobacterales DETECTED (A) NOT DETECTED Final    Comment: Enterobacterales represent a large order of gram negative bacteria, not a single organism. CRITICAL RESULT CALLED TO, READ BACK BY AND VERIFIED WITH: RODNEY GRUBB 06/19/23 @ 1404 BY SH    Enterobacter cloacae complex NOT DETECTED NOT DETECTED Final   Escherichia coli NOT DETECTED NOT DETECTED Final   Klebsiella aerogenes NOT DETECTED NOT DETECTED Final   Klebsiella oxytoca NOT DETECTED NOT DETECTED Final   Klebsiella pneumoniae DETECTED (A) NOT DETECTED Final    Comment: CRITICAL RESULT CALLED TO, READ BACK BY AND VERIFIED WITH: RODNEY GRUBB, Pharm D 06/19/23 @ 1404 BY [facility]  Proteus species NOT DETECTED NOT DETECTED Final   Salmonella species NOT DETECTED NOT DETECTED Final   Serratia marcescens NOT DETECTED NOT DETECTED Final   Haemophilus influenzae NOT DETECTED NOT DETECTED Final   Neisseria meningitidis NOT DETECTED NOT DETECTED Final   Pseudomonas aeruginosa NOT DETECTED NOT DETECTED Final   Stenotrophomonas maltophilia NOT DETECTED NOT DETECTED Final   Candida albicans NOT DETECTED NOT DETECTED Final   Candida auris NOT DETECTED NOT DETECTED Final   Candida glabrata NOT DETECTED NOT DETECTED Final   Candida krusei NOT DETECTED NOT DETECTED Final   Candida parapsilosis NOT DETECTED NOT DETECTED Final   Candida tropicalis NOT DETECTED NOT DETECTED Final   Cryptococcus neoformans/gattii NOT DETECTED NOT DETECTED Final   CTX-M ESBL NOT DETECTED NOT DETECTED Final   Carbapenem resistance IMP NOT DETECTED NOT DETECTED Final   Carbapenem resistance KPC NOT DETECTED NOT DETECTED Final   Carbapenem resistance NDM NOT DETECTED NOT DETECTED Final   Carbapenem resist OXA 48 LIKE NOT DETECTED NOT DETECTED Final   Carbapenem resistance VIM NOT DETECTED NOT DETECTED Final    Comment:  Performed at [facility], [address] [place], [place] [address]    Labs: CBC: Recent Labs  Lab 06/18/23 2351 06/20/23 0434 06/21/23 0501  WBC 16.4* 10.0 11.6*  NEUTROABS 12.6*  --  6.4  HGB 13.6 11.4* 10.8*  HCT 42.3 35.3* 33.6*  MCV 82.5 81.3 81.6  PLT 337 318 335   Basic Metabolic Panel: Recent Labs  Lab 06/18/23 2351 06/20/23 0434 06/21/23 0501  NA 132* 135 140  K 3.5 3.4* 3.3*  CL 98 102 105  CO2 20* 24 27  GLUCOSE 172* 140* 92  BUN 26* 20 24*  CREATININE 1.10* 0.60 0.77  CALCIUM  8.6* 7.8* 8.3*   Liver Function Tests: Recent Labs  Lab 06/18/23 2351 06/20/23 0434  AST 30 19  ALT 28 25  ALKPHOS 116 82  BILITOT 0.7 0.5  PROT 8.0 6.3*  ALBUMIN 4.0 2.9*   CBG: Recent Labs  Lab 06/20/23 0757 06/20/23 1142 06/20/23 1533 06/20/23 2126 06/21/23 0729  GLUCAP 128* 113* 109* 113* 107*    Discharge time spent: greater than 30 minutes.  Signed: Delon Herald, MD [facility] 06/21/2023

## 2023-06-22 LAB — CULTURE, BLOOD (ROUTINE X 2)
Culture  Setup Time: NO GROWTH
Special Requests: ADEQUATE

## 2023-06-23 ENCOUNTER — Encounter: Payer: Self-pay | Admitting: *Deleted

## 2023-06-26 LAB — CULTURE, BLOOD (ROUTINE X 2)
Culture: NO GROWTH
Culture: NO GROWTH
Special Requests: ADEQUATE
Special Requests: ADEQUATE

## 2023-06-27 ENCOUNTER — Telehealth: Payer: Self-pay | Admitting: *Deleted

## 2023-06-27 NOTE — Telephone Encounter (Signed)
 Pt calling triage with confusion on her having hospital follow-up blood work, advised pt of the recommendations below:   Recommendations at discharge:    Continue antibiotics until gone (Levaquin  daily until 06/25/23) Follow up with Dr. Sadie in 1-2 weeks; will need to review follow up blood cultures to ensure that no additional antibiotics are needed and repeat CBC/BMP Dr. Twylla from urology will contact you with an appointment in about 2 weeks  Pt will contact Dr. Sadie, her appt with urology is 07/28/23

## 2023-06-30 ENCOUNTER — Other Ambulatory Visit: Payer: Medicare Other | Admitting: Urology

## 2023-07-28 ENCOUNTER — Other Ambulatory Visit: Payer: Self-pay | Admitting: Sports Medicine

## 2023-07-28 ENCOUNTER — Other Ambulatory Visit: Payer: Medicare Other | Admitting: Urology

## 2023-07-28 DIAGNOSIS — G8929 Other chronic pain: Secondary | ICD-10-CM

## 2023-07-28 DIAGNOSIS — M1712 Unilateral primary osteoarthritis, left knee: Secondary | ICD-10-CM

## 2023-07-31 ENCOUNTER — Ambulatory Visit
Admission: RE | Admit: 2023-07-31 | Discharge: 2023-07-31 | Disposition: A | Discharge status: Home / Self Care | Payer: Medicare Other | Source: Ambulatory Visit | Attending: Sports Medicine | Admitting: Sports Medicine

## 2023-07-31 DIAGNOSIS — M1712 Unilateral primary osteoarthritis, left knee: Secondary | ICD-10-CM | POA: Diagnosis present

## 2023-07-31 DIAGNOSIS — M25562 Pain in left knee: Secondary | ICD-10-CM | POA: Insufficient documentation

## 2023-07-31 DIAGNOSIS — G8929 Other chronic pain: Secondary | ICD-10-CM | POA: Insufficient documentation

## 2023-08-19 ENCOUNTER — Ambulatory Visit: Payer: Medicare Other | Admitting: Urology

## 2023-08-19 ENCOUNTER — Encounter: Payer: Self-pay | Admitting: Urology

## 2023-08-19 VITALS — BP 143/84 | HR 85 | Ht 66.0 in | Wt 203.8 lb

## 2023-08-19 DIAGNOSIS — N390 Urinary tract infection, site not specified: Secondary | ICD-10-CM

## 2023-08-19 DIAGNOSIS — R8271 Bacteriuria: Secondary | ICD-10-CM

## 2023-08-19 DIAGNOSIS — R3129 Other microscopic hematuria: Secondary | ICD-10-CM

## 2023-08-19 LAB — URINALYSIS, COMPLETE
Bilirubin, UA: NEGATIVE
Glucose, UA: NEGATIVE
Ketones, UA: NEGATIVE
Nitrite, UA: NEGATIVE
Protein,UA: NEGATIVE
Specific Gravity, UA: 1.02 (ref 1.005–1.030)
Urobilinogen, Ur: 0.2 mg/dL (ref 0.2–1.0)
pH, UA: 6.5 (ref 5.0–7.5)

## 2023-08-19 LAB — MICROSCOPIC EXAMINATION

## 2023-08-19 MED ORDER — ESTRADIOL 0.1 MG/GM VA CREA: TOPICAL_CREAM | VAGINAL | 1 refills | Status: AC

## 2023-08-19 NOTE — Progress Notes (Signed)
   08/19/23  CC:  Chief Complaint  Patient presents with   Cysto    HPI: Refer to my previous note of 06/02/2023.  She was admitted 06/19/2023 with UTI.  Blood culture positive for Klebsiella CT urogram was performed in that hospitalization which showed evidence of focal pyelonephritis upper pole of left kidney.  There was a nonobstructing 8 mm left renal calculus  Blood pressure (!) 143/84, pulse 85, height 5\' 6"  (1.676 m), weight 203 lb 12.8 oz (92.4 kg). NED. A&Ox3.   No respiratory distress   Abd soft, NT, ND Normal external genitalia with patent urethral meatus  Cystoscopy Procedure Note  Patient identification was confirmed, informed consent was obtained, and patient was prepped using Betadine solution.  Lidocaine jelly was administered per urethral meatus.    Procedure: - Flexible cystoscope introduced, without any difficulty.   - Thorough search of the bladder revealed:    normal urethral meatus    normal urothelium    no stones    no ulcers     no tumors    no urethral polyps    no trabeculation  - Ureteral orifices were normal in position and appearance.  Post-Procedure: - Patient tolerated the procedure well  Assessment/ Plan: No bladder mucosal abnormalities CTU with a nonobstructing 8 mm left renal calculus.  She has had recurrent Klebsiella infections which is a urease producing bacteria and could potentially be stone related.  We discussed stone treatment options of ureteroscopy and SWL SWL has a lower stone free rate in a single procedure, but also a lower complication rate compared to ureteroscopy and avoids a stent and associated stent related symptoms. Possible complications include renal hematoma, steinstrasse, and need for additional treatment. Ureteroscopy with laser lithotripsy and stent placement has a higher stone free rate than SWL in a single procedure, however increased complication rate including possible infection, ureteral injury, bleeding, and  stent related morbidity. Common stent related symptoms include dysuria, urgency/frequency, and flank pain. She would like to think this over and will call if she desires to pursue treatment She was interested in starting low-dose vaginal estrogen and Rx sent to pharmacy Instructed to call for recurrent UTI symptoms and will see back in 3 months UA today with mild pyuria/microhematuria. She is asymptomatic   Riki Altes, MD

## 2023-08-24 LAB — CULTURE, URINE COMPREHENSIVE

## 2023-08-29 NOTE — Progress Notes (Signed)
 Talked with patient and she is not having symtons.

## 2023-11-16 ENCOUNTER — Encounter: Payer: Self-pay | Admitting: Urology

## 2023-11-16 ENCOUNTER — Ambulatory Visit (INDEPENDENT_AMBULATORY_CARE_PROVIDER_SITE_OTHER): Payer: Medicare Other | Admitting: Urology

## 2023-11-16 VITALS — BP 155/104 | HR 92 | Ht 66.0 in | Wt 213.0 lb

## 2023-11-16 DIAGNOSIS — N200 Calculus of kidney: Secondary | ICD-10-CM | POA: Diagnosis not present

## 2023-11-16 DIAGNOSIS — N390 Urinary tract infection, site not specified: Secondary | ICD-10-CM

## 2023-11-16 LAB — URINALYSIS, COMPLETE
Bilirubin, UA: NEGATIVE
Glucose, UA: NEGATIVE
Ketones, UA: NEGATIVE
Nitrite, UA: NEGATIVE
Protein,UA: NEGATIVE
Specific Gravity, UA: <1.005 — ABNORMAL LOW (ref 1.005–1.030)
Urobilinogen, Ur: 0.2 mg/dL (ref 0.2–1.0)
pH, UA: 6.5 (ref 5.0–7.5)

## 2023-11-16 LAB — MICROSCOPIC EXAMINATION
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
## 2023-12-03 ENCOUNTER — Encounter: Admission: EM | Disposition: A | Discharge status: Home / Self Care | Payer: Self-pay | Source: Home / Self Care

## 2023-12-03 ENCOUNTER — Other Ambulatory Visit: Payer: Self-pay

## 2023-12-03 ENCOUNTER — Emergency Department

## 2023-12-03 ENCOUNTER — Emergency Department: Admitting: Certified Registered"

## 2023-12-03 ENCOUNTER — Emergency Department: Admission: EM | Admit: 2023-12-03 | Discharge: 2023-12-03 | Disposition: A | Discharge status: Home / Self Care

## 2023-12-03 DIAGNOSIS — K2100 Gastro-esophageal reflux disease with esophagitis, without bleeding: Secondary | ICD-10-CM | POA: Diagnosis not present

## 2023-12-03 DIAGNOSIS — K449 Diaphragmatic hernia without obstruction or gangrene: Secondary | ICD-10-CM | POA: Insufficient documentation

## 2023-12-03 DIAGNOSIS — I10 Essential (primary) hypertension: Secondary | ICD-10-CM | POA: Diagnosis not present

## 2023-12-03 DIAGNOSIS — D509 Iron deficiency anemia, unspecified: Secondary | ICD-10-CM | POA: Diagnosis not present

## 2023-12-03 DIAGNOSIS — K259 Gastric ulcer, unspecified as acute or chronic, without hemorrhage or perforation: Secondary | ICD-10-CM | POA: Insufficient documentation

## 2023-12-03 DIAGNOSIS — W44F3XA Food entering into or through a natural orifice, initial encounter: Secondary | ICD-10-CM | POA: Diagnosis not present

## 2023-12-03 DIAGNOSIS — K225 Diverticulum of esophagus, acquired: Secondary | ICD-10-CM | POA: Diagnosis not present

## 2023-12-03 DIAGNOSIS — K222 Esophageal obstruction: Secondary | ICD-10-CM | POA: Insufficient documentation

## 2023-12-03 DIAGNOSIS — E0500 Thyrotoxicosis with diffuse goiter without thyrotoxic crisis or storm: Secondary | ICD-10-CM | POA: Insufficient documentation

## 2023-12-03 DIAGNOSIS — E119 Type 2 diabetes mellitus without complications: Secondary | ICD-10-CM | POA: Insufficient documentation

## 2023-12-03 DIAGNOSIS — I2510 Atherosclerotic heart disease of native coronary artery without angina pectoris: Secondary | ICD-10-CM | POA: Insufficient documentation

## 2023-12-03 DIAGNOSIS — T18128A Food in esophagus causing other injury, initial encounter: Secondary | ICD-10-CM | POA: Insufficient documentation

## 2023-12-03 DIAGNOSIS — G4730 Sleep apnea, unspecified: Secondary | ICD-10-CM | POA: Diagnosis not present

## 2023-12-03 DIAGNOSIS — R0789 Other chest pain: Secondary | ICD-10-CM | POA: Diagnosis present

## 2023-12-03 DIAGNOSIS — R1310 Dysphagia, unspecified: Secondary | ICD-10-CM | POA: Diagnosis not present

## 2023-12-03 DIAGNOSIS — K2210 Ulcer of esophagus without bleeding: Secondary | ICD-10-CM

## 2023-12-03 HISTORY — PX: ESOPHAGOGASTRODUODENOSCOPY: SHX5428

## 2023-12-03 LAB — BASIC METABOLIC PANEL WITH GFR
Anion gap: 9 (ref 5–15)
BUN: 20 mg/dL (ref 8–23)
CO2: 26 mmol/L (ref 22–32)
Calcium: 8.6 mg/dL — ABNORMAL LOW (ref 8.9–10.3)
Chloride: 106 mmol/L (ref 98–111)
Creatinine, Ser: 0.76 mg/dL (ref 0.44–1.00)
GFR, Estimated: >60 mL/min (ref >60)
Glucose, Bld: 123 mg/dL — ABNORMAL HIGH (ref 70–99)
Potassium: 3.9 mmol/L (ref 3.5–5.1)
Sodium: 141 mmol/L (ref 135–145)

## 2023-12-03 LAB — CBC
HCT: 33.6 % — ABNORMAL LOW (ref 36.0–46.0)
Hemoglobin: 10 g/dL — ABNORMAL LOW (ref 12.0–15.0)
MCH: 23.3 pg — ABNORMAL LOW (ref 26.0–34.0)
MCHC: 29.8 g/dL — ABNORMAL LOW (ref 30.0–36.0)
MCV: 78.1 fL — ABNORMAL LOW (ref 80.0–100.0)
Platelets: 452 10*3/uL — ABNORMAL HIGH (ref 150–400)
RBC: 4.3 MIL/uL (ref 3.87–5.11)
RDW: 19.1 % — ABNORMAL HIGH (ref 11.5–15.5)
WBC: 10.2 10*3/uL (ref 4.0–10.5)
nRBC: 0 % (ref 0.0–0.2)

## 2023-12-03 LAB — TROPONIN I (HIGH SENSITIVITY)
Troponin I (High Sensitivity): 6 ng/L (ref <18)
Troponin I (High Sensitivity): 6 ng/L (ref <18)

## 2023-12-03 SURGERY — EGD (ESOPHAGOGASTRODUODENOSCOPY)
Anesthesia: General

## 2023-12-03 MED ORDER — SODIUM CHLORIDE 0.9 % IV SOLN: INTRAVENOUS | Status: DC

## 2023-12-03 MED ORDER — SUCCINYLCHOLINE CHLORIDE 200 MG/10ML IV SOSY: PREFILLED_SYRINGE | INTRAVENOUS | Status: DC | PRN

## 2023-12-03 MED ORDER — OMEPRAZOLE 40 MG PO CPDR: 40.0000 mg | DELAYED_RELEASE_CAPSULE | Freq: Two times a day (BID) | ORAL | 0 refills | Status: AC

## 2023-12-03 MED ORDER — FENTANYL CITRATE (PF) 100 MCG/2ML IJ SOLN: INTRAMUSCULAR | Status: DC | PRN

## 2023-12-03 MED ORDER — OMEPRAZOLE 40 MG PO CPDR: 40.0000 mg | DELAYED_RELEASE_CAPSULE | Freq: Two times a day (BID) | ORAL | 0 refills | Status: DC

## 2023-12-03 MED ORDER — GLUCAGON HCL RDNA (DIAGNOSTIC) 1 MG IJ SOLR
1.0000 mg | Freq: Once | INTRAMUSCULAR | Status: AC
  Administered 2023-12-03: 1 mg via INTRAVENOUS
  Filled 2023-12-03: qty 1

## 2023-12-03 MED ORDER — DEXAMETHASONE SODIUM PHOSPHATE 10 MG/ML IJ SOLN: INTRAMUSCULAR | Status: DC | PRN

## 2023-12-03 MED ORDER — PROPOFOL 10 MG/ML IV BOLUS
INTRAVENOUS | Status: DC | PRN
Start: 2023-12-03 — End: 2023-12-03

## 2023-12-03 MED ORDER — METOCLOPRAMIDE HCL 5 MG/ML IJ SOLN
10.0000 mg | Freq: Once | INTRAMUSCULAR | Status: AC
  Administered 2023-12-03: 10 mg via INTRAVENOUS
  Filled 2023-12-03: qty 2

## 2023-12-03 MED ORDER — LIDOCAINE HCL (CARDIAC) PF 100 MG/5ML IV SOSY
PREFILLED_SYRINGE | INTRAVENOUS | Status: DC | PRN
  Administered 2023-12-03: 50 mg via INTRAVENOUS

## 2023-12-03 MED ORDER — ONDANSETRON HCL 4 MG/2ML IJ SOLN
4.0000 mg | Freq: Once | INTRAMUSCULAR | Status: AC
  Administered 2023-12-03: 4 mg via INTRAVENOUS
  Filled 2023-12-03: qty 2

## 2023-12-03 MED ORDER — FENTANYL CITRATE (PF) 100 MCG/2ML IJ SOLN
INTRAMUSCULAR | Status: AC
  Filled 2023-12-03: qty 2

## 2023-12-03 NOTE — ED Notes (Signed)
 Pt tells this RN that she has had her esophagus stretched two times in that past to help with her symptoms and to allow her to eat better w/o reflux, and it's always worked. States she needs muscle relaxants to help her today as she has also had done in the past.

## 2023-12-03 NOTE — ED Notes (Signed)
 PO challenge per EDP

## 2023-12-03 NOTE — Anesthesia Postprocedure Evaluation (Signed)
 Anesthesia Post Note  Patient: Jackie Robinson  Procedure(s) Performed: EGD (ESOPHAGOGASTRODUODENOSCOPY)  Patient location during evaluation: Endoscopy Anesthesia Type: General Level of consciousness: awake and alert Pain management: pain level controlled Vital Signs Assessment: post-procedure vital signs reviewed and stable Respiratory status: spontaneous breathing, nonlabored ventilation and respiratory function stable Cardiovascular status: blood pressure returned to baseline and stable Postop Assessment: no apparent nausea or vomiting Anesthetic complications: no   No notable events documented.   Last Vitals:  Vitals:   12/03/23 1300 12/03/23 1315  BP: (!) 135/90 128/86  Pulse: 85 83  Resp: 15 14  Temp:    SpO2: 100% 94%    Last Pain:  Vitals:   12/03/23 1257  TempSrc:   PainSc: 0-No pain                 Baltazar Bonier

## 2023-12-03 NOTE — ED Triage Notes (Signed)
 Coming from home. Pt reports she was eating salmon last night when she had sudden discomfort in chest. Pt reports difficulty swallowing her saliva and is spitting it out. Pt is talking in clear sentences at this time. PMH: hiatal hernia

## 2023-12-03 NOTE — ED Notes (Signed)
 Pt off to XR

## 2023-12-03 NOTE — ED Notes (Signed)
 Pt tells this RN she feels a bit better and spitting less since the glucagon . EDP notified.

## 2023-12-03 NOTE — ED Notes (Signed)
 Pt tells this RN that reglan didn't help with the spasms she feels. EDP notified.
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
## 2023-12-03 NOTE — ED Provider Notes (Signed)
 [facility] Provider Note    Event Date/Time   First MD Initiated Contact with Patient 12/03/23 [phone]     (approximate)   History   Chest Pain and Dysphagia  Coming from home. Pt reports she was eating salmon last night when she had sudden discomfort in chest. Pt reports difficulty swallowing her saliva and is spitting it out. Pt is talking in clear sentences at this time. PMH: hiatal hernia   HPI Jackie Robinson is a 68 y.o. female DM2, CAD s/p stent, hypertension, Graves' disease presents for evaluation of chest discomfort, dysphagia - Patient ate salmon last night, felt a got stuck in her esophagus.  Has been seen in the emergency department multiple times before per her report.  Not tolerating p.o., spitting out saliva.  No frank chest pain or shortness of breath.  No fever.  No abdominal pain. - Says her symptoms actually feel better than they did last night but remain persistent  Per chart review, last EGD 09/2022, impression: - Benign- appearing esophageal stenosis. Dilated.  - 3 cm hiatal hernia.  - Normal examined duodenum.  - No specimens collected.     Physical Exam   Triage Vital Signs: ED Triage Vitals  Encounter Vitals Group     BP 12/03/23 0725 (!) 145/109     Girls Systolic BP Percentile --      Girls Diastolic BP Percentile --      Boys Systolic BP Percentile --      Boys Diastolic BP Percentile --      Pulse Rate 12/03/23 0725 94     Resp 12/03/23 0725 16     Temp 12/03/23 0725 98.1 F (36.7 C)     Temp Source 12/03/23 0725 Oral     SpO2 12/03/23 0725 100 %     Weight 12/03/23 0740 212 lb 15.4 oz (96.6 kg)     Height 12/03/23 0740 5' 6 (1.676 m)     Head Circumference --      Peak Flow --      Pain Score 12/03/23 0723 9     Pain Loc --      Pain Education --      Exclude from Growth Chart --     Most recent vital signs: Vitals:   12/03/23 1300 12/03/23 1315  BP: (!) 135/90 128/86  Pulse: 85 83  Resp: 15 14  Temp:     SpO2: 100% 94%     General: Awake, no distress.  CV:  Good peripheral perfusion. RRR, RP 2+ Resp:  Normal effort. CTAB Abd:  No distention. Nontender to deep palpation throughout    ED Results / Procedures / Treatments   Labs (all labs ordered are listed, but only abnormal results are displayed) Labs Reviewed  BASIC METABOLIC PANEL WITH GFR - Abnormal; Notable for the following components:      Result Value   Glucose, Bld 123 (*)    Calcium  8.6 (*)    All other components within normal limits  CBC - Abnormal; Notable for the following components:   Hemoglobin 10.0 (*)    HCT 33.6 (*)    MCV 78.1 (*)    MCH 23.3 (*)    MCHC 29.8 (*)    RDW 19.1 (*)    Platelets 452 (*)    All other components within normal limits  TROPONIN I (HIGH SENSITIVITY)  TROPONIN I (HIGH SENSITIVITY)     EKG  See ED course below  RADIOLOGY Chest x-ray interpreted by myself and radiology report reviewed    PROCEDURES:  Critical Care performed: No  Procedures   MEDICATIONS ORDERED IN ED: Medications  0.9 %  sodium chloride  infusion ( Intravenous Canceled Entry 12/03/23 1247)  metoCLOPramide (REGLAN) injection 10 mg (10 mg Intravenous Given 12/03/23 0816)  ondansetron  (ZOFRAN ) injection 4 mg (4 mg Intravenous Given 12/03/23 0901)  glucagon  (human recombinant) (GLUCAGEN ) injection 1 mg (1 mg Intravenous Given 12/03/23 0901)     IMPRESSION / MDM / ASSESSMENT AND PLAN / ED COURSE  I reviewed the triage vital signs and the nursing notes.                              DDX/MDM/AP: Differential diagnosis includes, but is not limited to, esophageal foreign body, doubt ACS, no concern for aspiration.  Do not suspect bowel obstruction or other acute intra-abdominal pathology at this time.  Plan: - Will trial carbonated beverage - Consider glucagon  as needed - Consider escalation to GI if not tolerating p.o. after above interventions - EKG, chest x-ray, basic labs -  Antiemetic  Patient's presentation is most consistent with acute presentation with potential threat to life or bodily function.  The patient is on the cardiac monitor to evaluate for evidence of arrhythmia and/or significant heart rate changes.  ED course below.  Patient remains symptomatic after trial of carbonated beverage as well as glucagon  and Reglan and Zofran .  Did not tolerate p.o. challenge.  GI consulted due to concern about esophageal food bolus.  Taken from ED to endoscopy suite.  Clinical Course as of 12/03/23 1619  Sat Dec 03, 2023  0752 Ecg = sinus rhythm, rate 87, no gross ST elevation or depression, no significant repolarization abnormality, left axis deviation, normal intervals.  No clear evidence of ischemia nor arrhythmia on my interpretation. [MM]  0753 Chest x-ray reviewed, no acute pathology on my interpretation [MM]  0828 CBC with no leukocytosis, stable anemia [MM]  1010 Patient reevaluated, feeling somewhat better after glucagon .  Will trial applesauce. [MM]  1038 Patient now spitting up again, did not tolerate p.o. trial  GI paged [MM]  1043 D/w Dr. Baldomero [facility] Will eval [MM]    Clinical Course User Index [MM] Collis Deaner, MD     FINAL CLINICAL IMPRESSION(S) / ED DIAGNOSES   Final diagnoses:  Dysphagia, unspecified type     Rx / DC Orders   ED Discharge Orders          Ordered    omeprazole (PRILOSEC) 40 MG capsule  2 times daily before meals,   Status:  Discontinued        12/03/23 1249    omeprazole (PRILOSEC) 40 MG capsule  2 times daily before meals        12/03/23 1256             Note:  This document was prepared using Dragon voice recognition software and may include unintentional dictation errors.   Collis Deaner, MD 12/03/23 [phone]

## 2023-12-03 NOTE — Transfer of Care (Signed)
 Immediate Anesthesia Transfer of Care Note  Patient: Jackie Robinson  Procedure(s) Performed: EGD (ESOPHAGOGASTRODUODENOSCOPY)  Patient Location: PACU  Anesthesia Type:General  Level of Consciousness: drowsy  Airway & Oxygen Therapy: Patient Spontanous Breathing and Patient connected to nasal cannula oxygen  Post-op Assessment: Report given to RN, Post -op Vital signs reviewed and stable, and Patient moving all extremities  Post vital signs: Reviewed and stable  Last Vitals:  Vitals Value Taken Time  BP    Temp    Pulse 88 12/03/23 12:54  Resp 14 12/03/23 12:54  SpO2 100 % 12/03/23 12:54  Vitals shown include unfiled device data.  Last Pain:  Vitals:   12/03/23 1223  TempSrc: Temporal  PainSc: 0-No pain         Complications: No notable events documented.
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
## 2023-12-03 NOTE — ED Notes (Signed)
 Pt dressed out in gown for procedure, belongs placed in Pt belonging bag with Pt sticker,

## 2023-12-03 NOTE — ED Notes (Signed)
 EDP notified of Pt spitting up again. See new orders.

## 2023-12-03 NOTE — Anesthesia Procedure Notes (Signed)
 Procedure Name: Intubation Date/Time: 12/03/2023 12:32 PM  Performed by: Bobette Burrs, CRNAPre-anesthesia Checklist: Patient identified, Emergency Drugs available, Suction available and Patient being monitored Patient Re-evaluated:Patient Re-evaluated prior to induction Oxygen Delivery Method: Circle system utilized Preoxygenation: Pre-oxygenation with 100% oxygen Induction Type: IV induction Ventilation: Mask ventilation without difficulty Laryngoscope Size: McGrath and 3 Grade View: Grade I Tube type: Oral Tube size: 7.0 mm Number of attempts: 1 Airway Equipment and Method: Stylet, Oral airway and Bite block Placement Confirmation: ETT inserted through vocal cords under direct vision, positive ETCO2 and breath sounds checked- equal and bilateral Secured at: 21 cm Tube secured with: Tape Dental Injury: Teeth and Oropharynx as per pre-operative assessment

## 2023-12-04 ENCOUNTER — Encounter: Payer: Self-pay | Admitting: Gastroenterology

## 2023-12-05 LAB — CBG MONITORING, ED: Glucose-Capillary: 113 mg/dL — ABNORMAL HIGH (ref 70–99)

## 2023-12-13 NOTE — Op Note (Addendum)
 [facility] Gastroenterology Patient Name: Jackie Robinson Procedure Date: 12/03/2023 12:23 PM MRN: [id number] Account #: [id number] Date of Birth: 1956-04-17 Admit Type: Inpatient Age: 68 Room: [facility] ENDO ROOM 4 Gender: Female Note Status: Supervisor Override Instrument Name: Barnie Endoscope [id number] Procedure:             Upper GI endoscopy Indications:           Foreign body in the esophagus Providers:             Corinn Jess Brooklyn MD, MD Referring MD:          No Local Md, MD (Referring MD) Medicines:             General Anesthesia Complications:         No immediate complications. Estimated blood loss: None. Procedure:             Pre-Anesthesia Assessment:                        - Prior to the procedure, a History and Physical was                         performed, and patient medications and allergies were                         reviewed. The patient is competent. The risks and                         benefits of the procedure and the sedation options and                         risks were discussed with the patient. All questions                         were answered and informed consent was obtained.                         Patient identification and proposed procedure were                         verified by the physician, the nurse, the                         anesthesiologist, the anesthetist and the technician                         in the pre-procedure area in the procedure room in the                         endoscopy suite. Mental Status Examination: alert and                         oriented. Airway Examination: normal oropharyngeal                         airway and neck mobility. Respiratory Examination:                         clear to auscultation. CV Examination: normal.  Prophylactic Antibiotics: The patient does not require                         prophylactic antibiotics. Prior Anticoagulants: The                          patient has taken no anticoagulant or antiplatelet                         agents. ASA Grade Assessment: II - A patient with mild                         systemic disease. After reviewing the risks and                         benefits, the patient was deemed in satisfactory                         condition to undergo the procedure. The anesthesia                         plan was to use general anesthesia. Immediately prior                         to administration of medications, the patient was                         re-assessed for adequacy to receive sedatives. The                         heart rate, respiratory rate, oxygen saturations,                         blood pressure, adequacy of pulmonary ventilation, and                         response to care were monitored throughout the                         procedure. The physical status of the patient was                         re-assessed after the procedure.                        After obtaining informed consent, the endoscope was                         passed under direct vision. Throughout the procedure,                         the patient's blood pressure, pulse, and oxygen                         saturations were monitored continuously. The Endoscope                         was introduced through the mouth, and advanced to the  second part of duodenum. The upper GI endoscopy was                         accomplished without difficulty. The patient tolerated                         the procedure well. Findings:      LA Grade B (one or more mucosal breaks greater than 5 mm, not extending       between the tops of two mucosal folds) esophagitis with no bleeding was       found in the distal esophagus and gastroesophageal junction.      A medium-sized hiatal hernia with a few Cameron ulcers was found. The       Z-line was 40 cm from the incisors.      The duodenal bulb and second portion of the  duodenum were normal.      The gastric body was normal.      A non-bleeding Zenker's diverticulum with a small opening, no impacted       food and no stigmata of recent bleeding was found. Impression:            - LA Grade B reflux esophagitis with no bleeding.                        - Medium-sized hiatal hernia with a few Cameron ulcers.                        - Normal duodenal bulb and second portion of the                         duodenum.                        - Normal gastric body.                        - Zenker's diverticulum.                        - No specimens collected. Recommendation:        - Discharge patient to home (with escort).                        - Chopped diet.                        - Continue present medications.                        - Follow an antireflux regimen for the rest of the                         patient's life.                        - Use Prilosec (omeprazole ) 40 mg PO BID indefinitely. Procedure Code(s):     --- Professional ---                        [phone], Esophagogastroduodenoscopy, flexible,  transoral; diagnostic, including collection of                         specimen(s) by brushing or washing, when performed                         (separate procedure) Diagnosis Code(s):     --- Professional ---                        K21.00, Gastro-esophageal reflux disease with                         esophagitis, without bleeding                        K44.9, Diaphragmatic hernia without obstruction or                         gangrene                        K25.9, Gastric ulcer, unspecified as acute or chronic,                         without hemorrhage or perforation                        K22.5, Diverticulum of esophagus, acquired                        T18.108A, Unspecified foreign body in esophagus                         causing other injury, initial encounter CPT copyright 2022 American Medical Association. All rights  reserved. The codes documented in this report are preliminary and upon coder review may  be revised to meet current compliance requirements. Dr. Corinn Brooklyn Corinn Jess Brooklyn MD, MD 12/13/2023 12:47:51 PM This report has been signed electronically. Number of Addenda: 0 Note Initiated On: 12/03/2023 12:23 PM Estimated Blood Loss:  Estimated blood loss: none. Estimated blood loss: none.      [facility]

## 2023-12-22 ENCOUNTER — Other Ambulatory Visit: Payer: Self-pay

## 2023-12-22 ENCOUNTER — Emergency Department
Admission: EM | Admit: 2023-12-22 | Discharge: 2023-12-22 | Disposition: A | Discharge status: Home / Self Care | Attending: Emergency Medicine | Admitting: Emergency Medicine

## 2023-12-22 ENCOUNTER — Emergency Department

## 2023-12-22 DIAGNOSIS — M7989 Other specified soft tissue disorders: Secondary | ICD-10-CM | POA: Diagnosis present

## 2023-12-22 DIAGNOSIS — I2510 Atherosclerotic heart disease of native coronary artery without angina pectoris: Secondary | ICD-10-CM | POA: Diagnosis not present

## 2023-12-22 DIAGNOSIS — E119 Type 2 diabetes mellitus without complications: Secondary | ICD-10-CM | POA: Diagnosis not present

## 2023-12-22 DIAGNOSIS — I10 Essential (primary) hypertension: Secondary | ICD-10-CM | POA: Insufficient documentation

## 2023-12-22 NOTE — Progress Notes (Signed)
 [facility] Chief Complaint:   Chief Complaint  Patient presents with  . Left Knee - Follow-up, Pain    History of Present Illness:    Jackie Robinson is a 68 y.o. female that presents to clinic today for follow up evaluation and management of chronic left leg knee pains possibly due to knee arthritis versus bursitis versus bone contusion/insufficiency fracture.  They were last evaluated by myself on 08/17/2023.  At that time, the plan was to perform a pes anserine bursa steroid injection for proximal/medial tibia pain.  She did get good improvement in symptoms for about 3 months from this.  She comes in today for concerns of leg pain and swelling.  At the time of this visit, there was no new information to review.  Her most recent labs from 08/16/2023 which show creatinine 0.8, normal electrolytes, normal liver function, albumin 4.2, decreased hemoglobin.  She notes that she is having issues with thyroid  eye disease and may need to move to [place]  to live with her son in the future.  Today, the patient reports their primary concern is swelling in her left leg which is causing dorsal foot pain.  She wondered if this was related to her arthritis or another joint issue.  She does not endorse any focal pain around her hip, knee, ankle but only her dorsal foot where her shoe hits.  She attributes the symptoms to swelling in the left leg.  She currently rates pain severity as a 9/10.  She reports associated swelling, limping, weakness, pain at night.  She denies associated locking/catching, instability, numbness or tingling, fevers or chills, night sweats, weight loss, skin color change.  She has also previously used intra-articular knee joint steroid injection, prednisone , celecoxib, knee brace, tramadol .   She is right hand dominant and retired.   She feels like her symptoms are tolerable but she does make concessions to activities to avoid  pain.  Medications, Past Medical/Surgical/Family/Social History:   Current Outpatient Medications  Medication Sig Dispense Refill  . aspirin  81 MG EC tablet Take 1 tablet (81 mg total) by mouth once daily    . denosumab (PROLIA SUBQ) Inject subcutaneously    . ergocalciferol, vitamin D2, 1,250 mcg (50,000 unit) capsule Take 1 capsule (50,000 Units total) by mouth every 7 (seven) days 4 capsule 11  . estradioL  (ESTRACE ) 0.01 % (0.1 mg/gram) vaginal cream Place 0.5 g vaginally twice a week    . ezetimibe  (ZETIA ) 10 mg tablet Take 1 tablet (10 mg total) by mouth once daily 30 tablet 11  . fexofenadine-pseudoephedrine (ALLEGRA-D 12H) 60-120 mg ER tablet Take 1 tablet by mouth once daily    . fluticasone propionate (FLONASE) 50 mcg/actuation nasal spray Place 1 spray into both nostrils 2 (two) times daily 16 g 0  . metFORMIN  (GLUCOPHAGE ) 500 MG tablet Take 1 tablet (500 mg total) by mouth 2 (two) times daily with meals 60 tablet 11  . metoprolol  SUCCinate (TOPROL -XL) 25 MG XL tablet Take 0.5 tablets (12.5 mg total) by mouth once daily 45 tablet 3  . MULTIVITAMIN ORAL Take 1 tablet by mouth once daily.    SABRA omega 3-dha-epa-fish oil (FISH OIL) 1,000 mg (120 mg-180 mg) Cap Take by mouth    . omeprazole  (PRILOSEC) 40 MG DR capsule Take 1 capsule (40 mg total) by mouth 2 (two) times daily before meals 180 capsule 3  . rosuvastatin  (CRESTOR ) 10 MG tablet Take 1 tablet (10 mg total) by mouth once  daily 90 tablet 0  . selenium 100 mcg Tab Take 1 tablet (100 mcg total) by mouth 2 (two) times daily for 150 days 60 tablet 4  . SYNTHROID  88 mcg tablet Take 1 tablet (88 mcg total) by mouth once daily Monday through Saturday.  Take 1 and 1/2 tabs each week on Sunday.  Take on an empty stomach with a glass of water at least 30-60 minutes before breakfast. 32 tablet 11  . UNABLE TO FIND Med Name: black current oil    . vitamin E 400 UNIT capsule Take 400 Units by mouth once daily     No current  facility-administered medications for this visit.    SECONDARY CONDITIONS THAT INFLUENCE TREATMENT AND DECISION-MAKING:  Non-smoker   Past Medical History: Obesity, hypertension, NSTEMI status post stent placement post ablative hypothyroid, hyperlipidemia on statin, vitamin D deficiency, type 2 diabetes not on insulin , osteoporosis, lumbar radiculopathy, history of left distal femoral diaphysis fracture treated nonsurgically   Past Surgical History: Splenectomy, left leg traction pinning, heart stenting   Relevant Orthopedic Family History: Osteoporosis, osteoarthritis  Physical Examination:   BP 132/82   Ht 167.6 cm (5' 6)   Wt 98 kg (216 lb)   LMP  (LMP Unknown)   BMI 34.86 kg/m  General/Constitutional: Well-nourished, well developed, no apparent distress. Psych: Normal mood and affect.  Conversant.  Judgement intact. Musculoskeletal: Comprehensive Knee Exam: Gait Antalgic but fluid without ambulatory aid    Inspection   Left  Skin + chronic indentation on distal/lateral thigh.   No ecchymosis or erythema.  Soft Tissue No focal soft tissue swelling    Palpation    Left  Tenderness No peripatellar, patellar tendon, quad tendon, medial/lateral joint line pain  Crepitus Minimal crepitus  Effusion None    Range of Motion   Left  Flexion  Limited to 120 degrees  Extension  Full knee extension without hyperextension    Neurovascular   Left  Quadriceps Strength 5/5  Hamstring Strength 5/5    Tests Performed/Ordered:   None  Tests Previously Reviewed:  EXAM: Left knee MRI without contrast performed 07/31/2023 at [facility]  IMPRESSION:  1. Severe degeneration of the posterior horn medial meniscus without a discrete tear.  2. Mild proximal MCL sprain.  3. Tricompartmental cartilage abnormalities as described above.  4. Moderate tendinosis of the semimembranosus tendon insertion.   ---------------------------------------------------------------------------------------------------------------- EXAM: Left Knee Radiographs - 3 views (Bilateral AP, Lateral, Bilateral Sunrise) performed 06/30/2023   CLINICAL INFORMATION: Acute on chronic left knee pain   COMPARISON: Left Knee Radiographs - 3 views (Bilateral AP, Lateral, Bilateral Sunrise) performed 04/15/2021   FINDINGS:   Valgus tibiofemoral alignment.  Normal patellofemoral alignment.  Chronic, well-healed distal femoral diaphysis fracture with apex dorsal angulation.  No acute fractures.  No soft tissue swelling or joint effusion.  Small calcification anterior to the tibial tubercle is likely sequela of previous trauma.  Stable appearance to mild tricompartmental degenerative joint disease worse in the patellofemoral compartment.  The patellofemoral compartment has joint space narrowing, osteophyte formation.  There are small osteophytes on the medial and lateral compartments as well.  No loose bodies.   I personally reviewed and visualized the imaging studies if available.   Assessment:     ICD-10-CM  1. Chronic pain of left knee  M25.562   G89.29  2. Primary osteoarthritis of left knee  M17.12  3. Pes anserinus bursitis of left knee  M70.52   Plan:   I have discussed the nature of her  current subjective complaints, clinical examination, test results and have reviewed treatment options.  The plan is to do the following;  - The patient has left leg pain and swelling of unknown etiology.  She has been treated before for chronic left knee pain initially treated for aggravation of arthritis with numerous classes of medication and intra-articular knee steroid injection with ongoing symptoms before seeming to get best relief from pes anserine bursa steroid injection.  She could have pain from knee joint swelling and Baker's cyst versus pes anserine bursitis versus myofascial pain versus radicular pain versus vascular  pain. - She is more concerned about some soft tissue swelling in her whole leg.  She does not clinically seem to have a DVT.  No focal issues around her joints.  She has previous x-ray of the knee showing stable appearance to her arthritis.  Her prior knee MRI results show MCL sprain and pes anserine bursitis, tricompartmental arthritis.  I explained that her symptoms are more concerning for potential venous insufficiency or other issue that would not be directly treated by orthopedics.  I recommended discussing this with her PCP office for further testing.  In the interim, she can use compression stockings.  I did perform a point-of-care musculoskeletal ultrasound that did not show any knee joint effusion or Baker's cyst.  There also did not appear to be any soft tissue signal to indicate soft tissue edema around the knee. - Daily activity as tolerated. Modify activity as needed according to symptoms. No limitations to pain-free weight bearing.  She can use a knee brace as needed for comfort.  She can use a cane or walking stick as needed for ambulation. - Continue home exercise program to maintain strength, flexibility, and endurance. - Use Tylenol , anti-inflammatories, topical diclofenac/pain cream/pain patch, relative rest, elevation, compression, massage, and ice/heat as needed for pain.   - Follow up as needed.  She may be moving to [place]  soon and need to establish care there.  I spent a total of 32 minutes in both face-to-face and non face-to-face activities, excluding procedures performed, for this visit on the date of this encounter which included: Patient Encounter, Counseling/Educating on possible underlying etiology of symptoms plus treatment moving forward shared decision making, and Documentation.  Contact our office with any questions or concerns.  Follow up as indicated, or sooner should any new problems arise, if conditions worsen, or if they are otherwise concerned.    Prentice Reges, DO [facility] [address] [place], [place] [address] Phone: [phone]  This note was generated in part with voice recognition software and I apologize for any typographical errors that were not detected and corrected.

## 2023-12-22 NOTE — ED Triage Notes (Signed)
 Pt comes with left knee and leg swelling. Pt states she had fall 4 months ago and since has had the issues.

## 2023-12-22 NOTE — Discharge Instructions (Signed)
 The ultrasound today was negative for blood clot.  Please follow-up with your primary care provider and your orthopedic provider.  I recommend wearing a compression stocking and elevating your legs to help decrease swelling.  Try treating your pain with Tylenol  instead of ibuprofen as ibuprofen can sometimes cause swelling in the legs.

## 2023-12-22 NOTE — ED Provider Notes (Signed)
 [facility] Provider Note    Event Date/Time   First MD Initiated Contact with Patient 12/22/23 1634     (approximate)   History   Leg Swelling   HPI  Jackie Robinson is a 68 y.o. female with PMH of diabetes, CAD, hypertension and Graves' disease presents for evaluation of left leg swelling.  Patient states that she had a fall several months ago and the swelling began after this.  She had x-rays taken at the time of the fall which were negative for fractures.  She has been seen by orthopedics, and was recommended by her orthopedic provider to come to the emergency department for DVT rule out today.      Physical Exam   Triage Vital Signs: ED Triage Vitals  Encounter Vitals Group     BP 12/22/23 1607 (!) 143/105     Girls Systolic BP Percentile --      Girls Diastolic BP Percentile --      Boys Systolic BP Percentile --      Boys Diastolic BP Percentile --      Pulse Rate 12/22/23 1607 95     Resp 12/22/23 1607 17     Temp 12/22/23 1607 98.6 F (37 C)     Temp src --      SpO2 12/22/23 1607 95 %     Weight 12/22/23 1606 212 lb 15.4 oz (96.6 kg)     Height 12/22/23 1606 5' 6 (1.676 m)     Head Circumference --      Peak Flow --      Pain Score 12/22/23 1608 7     Pain Loc --      Pain Education --      Exclude from Growth Chart --     Most recent vital signs: Vitals:   12/22/23 1607  BP: (!) 143/105  Pulse: 95  Resp: 17  Temp: 98.6 F (37 C)  SpO2: 95%   General: Awake, no distress.  CV:  Good peripheral perfusion.  Resp:  Normal effort.  Abd:  No distention.  Other:  Entire left leg does appear larger than the right when patient is standing, non pitting edema throughout the leg, TTP in the ankle, DP pulse is 2+ and regular, no overlying skin changes, redness or venous stasis features   ED Results / Procedures / Treatments   Labs (all labs ordered are listed, but only abnormal results are displayed) Labs Reviewed - No data  to display  RADIOLOGY  Left leg ultrasound obtained to evaluate for DVT, interpreted the images as well as reviewed the radiologist report which was negative.   PROCEDURES:  Critical Care performed: No  Procedures   MEDICATIONS ORDERED IN ED: Medications - No data to display   IMPRESSION / MDM / ASSESSMENT AND PLAN / ED COURSE  I reviewed the triage vital signs and the nursing notes.                             68 year old female presents for evaluation of leg swelling.  Blood pressure is elevated otherwise vital signs are stable.  Patient NAD on exam.  Differential diagnosis includes, but is not limited to, DVT, lymphedema, kidney failure, liver failure, heart failure,   Patient's presentation is most consistent with acute complicated illness / injury requiring diagnostic workup.  Reviewed patient's chart and previous lab work. BMP from 6/14 shows normal renal function  and CMP from 2/25 shows normal liver function. Considered getting repeat blood work today but do not feel this is necessary. Patient denies CP, SOB, orthopnea and dyspnea with exertion and does not have history of CHF.   Ultrasound was negative for DVT.  Suspect that patient's leg swelling is from lymphedema, recommended follow up with her PCP, limb elevation, and compression stockings. Discussed taking tylenol  instead of ibuprofen as this may be a potential cause as well. Patient voiced understanding, all questions were answered and she was stable at discharge.      FINAL CLINICAL IMPRESSION(S) / ED DIAGNOSES   Final diagnoses:  Left leg swelling     Rx / DC Orders   ED Discharge Orders     None        Note:  This document was prepared using Dragon voice recognition software and may include unintentional dictation errors.   Cleaster Tinnie LABOR, PA-C 12/22/23 1827    Viviann Pastor, MD 12/22/23 [phone]

## 2023-12-22 NOTE — ED Notes (Signed)
 Pt discharged home in good condition, d/c papers provided and reviewed. Pt ambulatory by self to discharge office.

## 2024-01-17 ENCOUNTER — Other Ambulatory Visit: Payer: Self-pay | Admitting: Neurology

## 2024-01-17 DIAGNOSIS — H5789 Other specified disorders of eye and adnexa: Secondary | ICD-10-CM

## 2024-01-17 DIAGNOSIS — H532 Diplopia: Secondary | ICD-10-CM

## 2024-01-19 ENCOUNTER — Ambulatory Visit
Admission: RE | Admit: 2024-01-19 | Discharge: 2024-01-19 | Disposition: A | Discharge status: Home / Self Care | Source: Ambulatory Visit | Attending: Neurology | Admitting: Neurology

## 2024-01-19 DIAGNOSIS — H5789 Other specified disorders of eye and adnexa: Secondary | ICD-10-CM | POA: Insufficient documentation

## 2024-01-19 DIAGNOSIS — H532 Diplopia: Secondary | ICD-10-CM | POA: Insufficient documentation

## 2024-01-19 DIAGNOSIS — E079 Disorder of thyroid, unspecified: Secondary | ICD-10-CM | POA: Diagnosis present

## 2024-01-19 MED ORDER — GADOBUTROL 1 MMOL/ML IV SOLN
9.0000 mL | Freq: Once | INTRAVENOUS | Status: AC | PRN
  Administered 2024-01-19: 9 mL via INTRAVENOUS

## 2024-05-12 ENCOUNTER — Encounter: Payer: Self-pay | Admitting: Emergency Medicine

## 2024-05-12 ENCOUNTER — Other Ambulatory Visit: Payer: Self-pay

## 2024-05-12 ENCOUNTER — Emergency Department

## 2024-05-12 ENCOUNTER — Emergency Department
Admission: EM | Admit: 2024-05-12 | Discharge: 2024-05-12 | Disposition: A | Discharge status: Home / Self Care | Attending: Emergency Medicine | Admitting: Emergency Medicine

## 2024-05-12 DIAGNOSIS — I10 Essential (primary) hypertension: Secondary | ICD-10-CM | POA: Diagnosis not present

## 2024-05-12 DIAGNOSIS — E119 Type 2 diabetes mellitus without complications: Secondary | ICD-10-CM | POA: Insufficient documentation

## 2024-05-12 DIAGNOSIS — S6991XA Unspecified injury of right wrist, hand and finger(s), initial encounter: Secondary | ICD-10-CM | POA: Diagnosis present

## 2024-05-12 DIAGNOSIS — S52531A Colles' fracture of right radius, initial encounter for closed fracture: Secondary | ICD-10-CM | POA: Diagnosis not present

## 2024-05-12 DIAGNOSIS — I2510 Atherosclerotic heart disease of native coronary artery without angina pectoris: Secondary | ICD-10-CM | POA: Insufficient documentation

## 2024-05-12 DIAGNOSIS — W19XXXA Unspecified fall, initial encounter: Secondary | ICD-10-CM | POA: Diagnosis not present

## 2024-05-12 MED ORDER — LIDOCAINE HCL (PF) 1 % IJ SOLN
10.0000 mL | Freq: Once | INTRAMUSCULAR | Status: AC
  Administered 2024-05-12: 10 mL
  Filled 2024-05-12: qty 10

## 2024-05-12 MED ORDER — OXYCODONE HCL 5 MG PO TABS
5.0000 mg | ORAL_TABLET | Freq: Three times a day (TID) | ORAL | 0 refills | Status: AC | PRN
Start: 2024-05-12 — End: 2025-05-12

## 2024-05-12 MED ORDER — HYDROMORPHONE HCL 1 MG/ML IJ SOLN
1.0000 mg | Freq: Once | INTRAMUSCULAR | Status: AC
  Administered 2024-05-12: 1 mg via INTRAVENOUS
  Filled 2024-05-12: qty 1

## 2024-05-12 NOTE — ED Provider Notes (Signed)
 [facility] Provider Note    Event Date/Time   First MD Initiated Contact with Patient 05/12/24 1738     (approximate)   History   Fall and Wrist Injury   HPI  Jackie Robinson is a 68 y.o. female who presents to the ED for evaluation of Fall and Wrist Injury   Review a cardiology clinic visit from August.  CAD s/p LAD DES, associated coronary perforation and pericardial tamponade 2022.  HTN, HLD, DM.  No anticoagulation.  Patient presents after mechanical fall, FOOSH and right wrist injury.  Presents with her son.  No head trauma, syncope or other trauma  Physical Exam   Triage Vital Signs: ED Triage Vitals [05/12/24 1659]  Encounter Vitals Group     BP (!) 130/100     Girls Systolic BP Percentile      Girls Diastolic BP Percentile      Boys Systolic BP Percentile      Boys Diastolic BP Percentile      Pulse Rate 92     Resp 18     Temp 97.7 F (36.5 C)     Temp Source Oral     SpO2 95 %     Weight      Height      Head Circumference      Peak Flow      Pain Score 10     Pain Loc      Pain Education      Exclude from Growth Chart     Most recent vital signs: Vitals:   05/12/24 1659  BP: (!) 130/100  Pulse: 92  Resp: 18  Temp: 97.7 F (36.5 C)  SpO2: 95%    General: Awake, no distress.  CV:  Good peripheral perfusion.  Resp:  Normal effort.  Abd:  No distention.  MSK:  Close deformity of the right wrist.  Palpation of all 4 extremities otherwise without evidence of deformity, tenderness or trauma Neuro:  No focal deficits appreciated. Other:     ED Results / Procedures / Treatments   Labs (all labs ordered are listed, but only abnormal results are displayed) Labs Reviewed - No data to display  EKG   RADIOLOGY X-ray right wrist interpreted by me with comminuted distal radius and ulnar fracture  Official radiology report(s): DG Wrist Complete Right Result Date: 05/12/2024 CLINICAL DATA:  Fall with deformity.  EXAM: RIGHT WRIST - COMPLETE 3+ VIEW COMPARISON:  None Available. FINDINGS: There is a comminuted impaction fracture of the distal radial metaphysis with dorsal angulation of the distal fracture fragment. There is a displaced ulnar styloid fracture and fracture of the distal ulnar metadiaphysis with dorsal and medial displacement. The remaining bony structures appear intact. Soft tissue swelling is noted about the wrist and distal forearm. IMPRESSION: Comminuted displaced fractures of the distal radius and ulna. Electronically Signed   By: Leita Birmingham M.D.   On: 05/12/2024 17:20    PROCEDURES and INTERVENTIONS:  .Ortho Injury Treatment  Date/Time: 05/12/2024 8:35 PM  Performed by: Claudene Rover, MD Authorized by: Claudene Rover, MD   Consent:    Consent obtained:  Verbal   Consent given by:  PatientInjury location: forearm Location details: right forearm Injury type: fracture Fracture type: distal radius and ulnar styloid Pre-procedure neurovascular assessment: neurovascularly intact Pre-procedure distal perfusion: normal Pre-procedure neurological function: normal Pre-procedure range of motion: reduced Anesthesia: hematoma block  Anesthesia: Local anesthesia used: yes Local Anesthetic: lidocaine  1% without epinephrine Anesthetic total:  10 mL  Patient sedated: NoManipulation performed: yes Skeletal traction used: yes Immobilization: splint Splint type: sugar tong Splint Applied by: ED Provider Supplies used: cotton padding, elastic bandage and Ortho-Glass Post-procedure neurovascular assessment: post-procedure neurovascularly intact Post-procedure distal perfusion: normal Post-procedure neurological function: normal Post-procedure range of motion: improved     Medications  lidocaine  (PF) (XYLOCAINE ) 1 % injection 10 mL (10 mLs Infiltration Given by Other 05/12/24 1803)  HYDROmorphone  (DILAUDID ) injection 1 mg (1 mg Intravenous Given 05/12/24 1803)     IMPRESSION / MDM /  ASSESSMENT AND PLAN / ED COURSE  I reviewed the triage vital signs and the nursing notes.  Differential diagnosis includes, but is not limited to, fracture, dislocation, open injury, polytrauma  {Patient presents with symptoms of an acute illness or injury that is potentially life-threatening.  Patient presents after mechanical fall, FOOSH and with distal radius fracture requiring bedside reduction and suitable for outpatient management.  Isolated injury, x-ray confirms fracture.  Hematoma block, hanging by her fingers and with skeletal traction.  Place sugar-tong splint, provide sling.   She is visiting from out of town and prefers to follow-up with an orthopedist back home in [place]  after she returns there next week.  Discussed splint care and ED return precautions  Clinical Course as of 05/12/24 2037  Sat May 12, 2024  1843 Hematoma block [DS]  2026 Sugar tong [DS]    Clinical Course User Index [DS] Claudene Rover, MD     FINAL CLINICAL IMPRESSION(S) / ED DIAGNOSES   Final diagnoses:  Fall, initial encounter  Closed Colles' fracture of right radius, initial encounter     Rx / DC Orders   ED Discharge Orders          Ordered    oxyCODONE  (ROXICODONE ) 5 MG immediate release tablet  Every 8 hours PRN        05/12/24 2031             Note:  This document was prepared using Dragon voice recognition software and may include unintentional dictation errors.   Claudene Rover, MD 05/12/24 2037

## 2024-05-12 NOTE — ED Notes (Signed)
 Patient requested for a disposable T-shirt. This tech assisted pt changing T-shirts.

## 2024-05-12 NOTE — Discharge Instructions (Addendum)
 You broke both radius and ulna near your wrist on the right side after falling.   Follow-up with an orthopedic surgeon in the clinic after you get back home, ideally within 1-2 weeks of your original injury.  Ice, elevation  Try to keep the splint clean, use the sling for support, can replace the Ace wrap as needed if it gets dirty  Use Tylenol  for pain and fevers.  Up to 1000 mg per dose, up to 4 times per day.  Do not take more than 4000 mg of Tylenol /acetaminophen  within 24 hours..  Oxycodone  as needed for more severe/breakthrough pain.  Do not drive while taking this medication  Do not mix oxycodone  and tramadol 

## 2024-05-12 NOTE — ED Notes (Signed)
 Patient leaving ED with son. Upon discharge patient is a/o x4, ambulatory without assistance. Discharge instructions reviewed with patient and son, bot verbalized understanding.

## 2024-05-12 NOTE — ED Triage Notes (Signed)
 Patient arrives by POV states she tripped and fell onto her right side. Patient holding right wrist. C/o pain to whole right side of body. Denies hitting head.
